# Patient Record
Sex: Male | Born: 1955 | Race: White | Hispanic: No | Marital: Married | Smoking: Never smoker
Health system: Southern US, Community
[De-identification: ages and names within clinical notes are randomized; demographics above are authoritative.]

## PROBLEM LIST (undated history)

## (undated) DIAGNOSIS — Z9889 Other specified postprocedural states: Secondary | ICD-10-CM

## (undated) DIAGNOSIS — M1990 Unspecified osteoarthritis, unspecified site: Secondary | ICD-10-CM

## (undated) DIAGNOSIS — I10 Essential (primary) hypertension: Secondary | ICD-10-CM

## (undated) DIAGNOSIS — R112 Nausea with vomiting, unspecified: Secondary | ICD-10-CM

## (undated) HISTORY — PX: APPENDECTOMY: SHX54

## (undated) HISTORY — PX: JOINT REPLACEMENT: SHX530

---

## 2005-03-02 ENCOUNTER — Inpatient Hospital Stay (HOSPITAL_COMMUNITY): Admission: RE | Admit: 2005-03-02 | Discharge: 2005-03-05 | Payer: Self-pay | Admitting: Orthopedic Surgery

## 2005-04-11 ENCOUNTER — Observation Stay (HOSPITAL_COMMUNITY): Admission: RE | Admit: 2005-04-11 | Discharge: 2005-04-12 | Payer: Self-pay | Admitting: Orthopedic Surgery

## 2006-07-08 IMAGING — CR DG CHEST 2V
2 series · 2 of 2 positions shown · non-contrast
Comparison: none

CLINICAL DATA: Preoperative respiratory exam for osteoarthritis of the knee.  Hypertension.  
 CHEST - 2 VIEWS:   
 The heart is at the upper limits of normal in size.  The mediastinum is unremarkable.  The lungs are clear.   No effusions.  Ordinary degenerative changes affect the spine.

[view not recorded (1 of 2)]
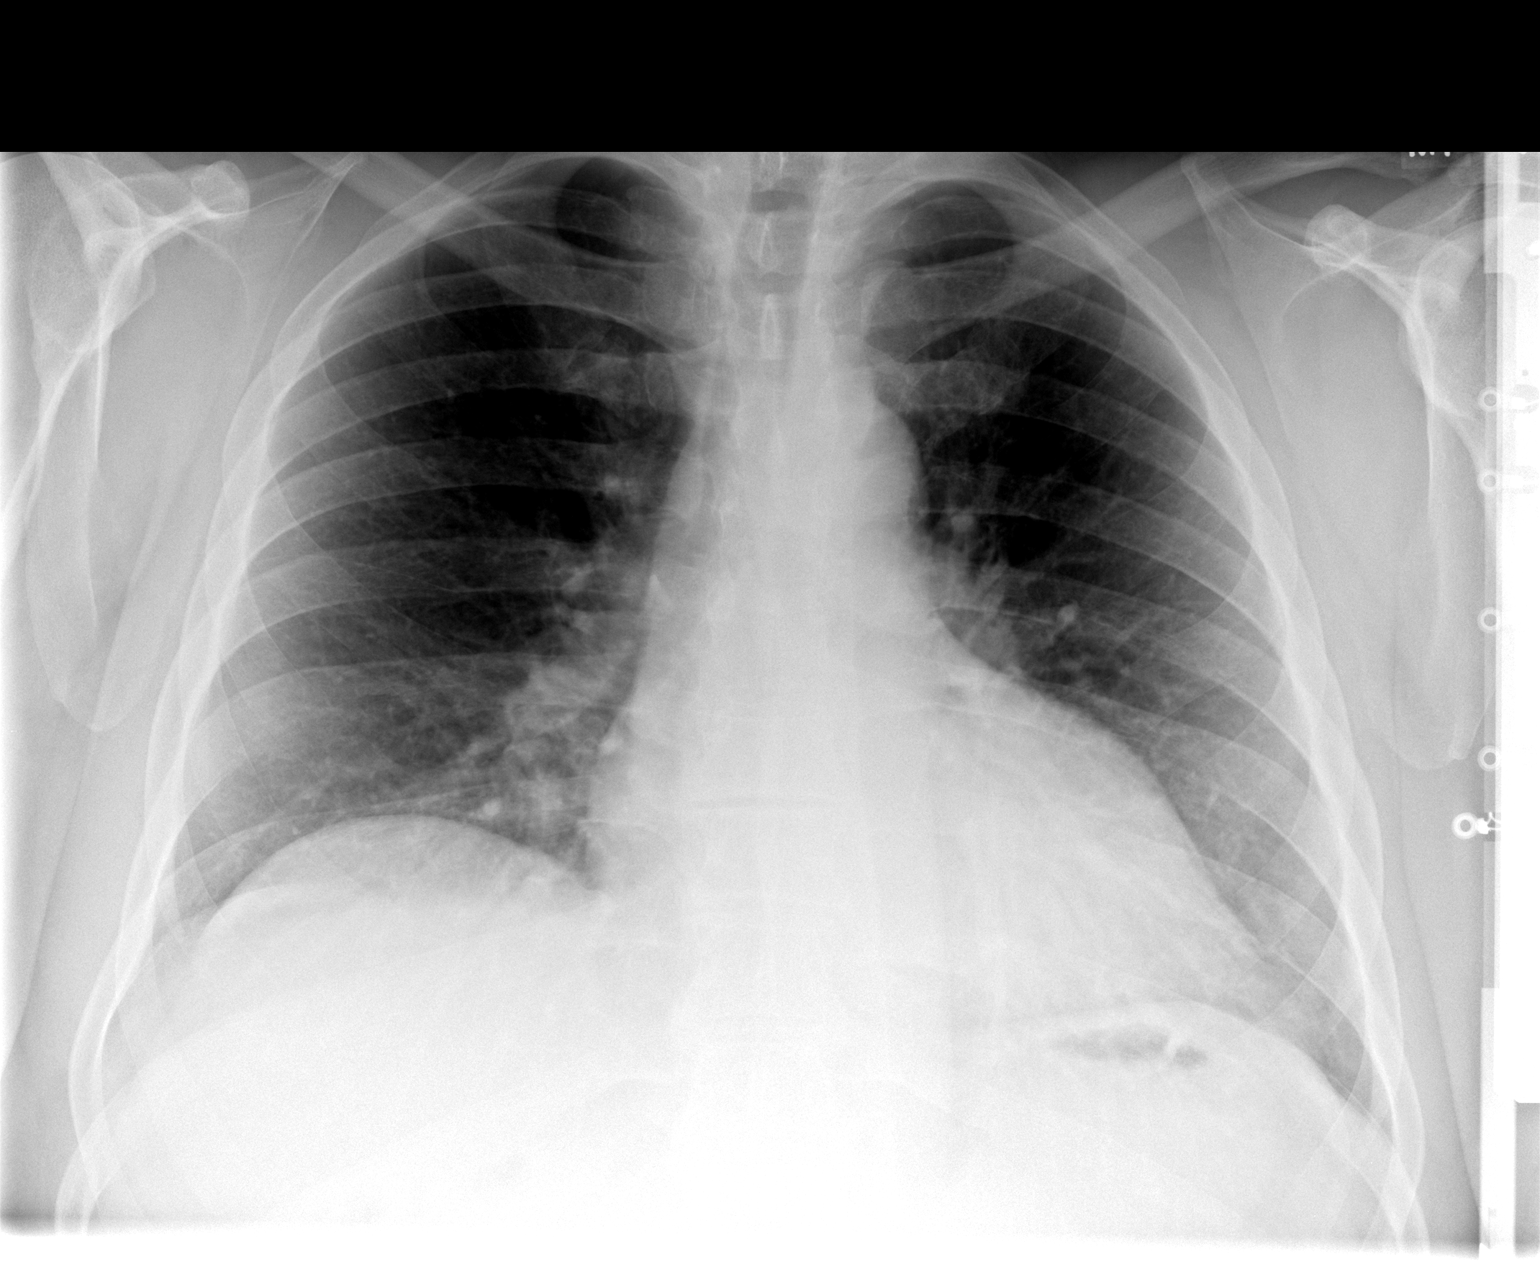

[view not recorded (2 of 2)]
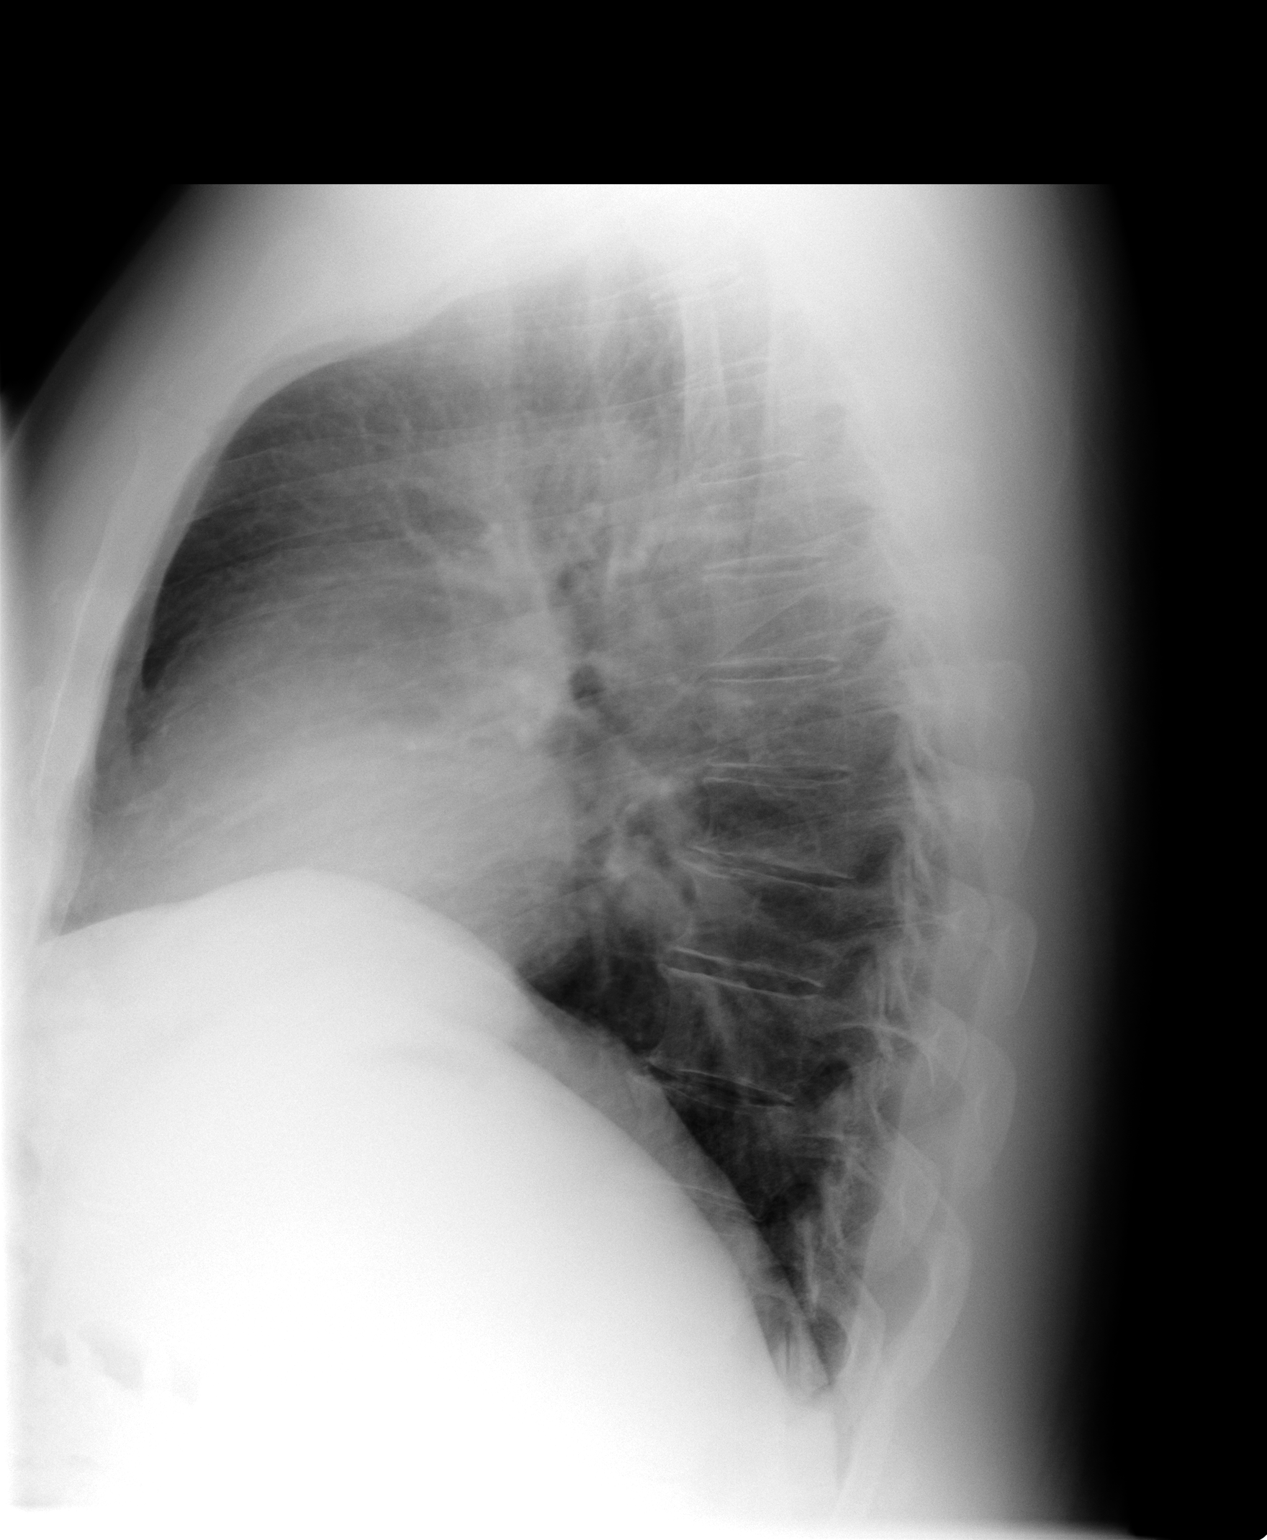

[2 of 2 positions shown; findings below may reference images not displayed]

IMPRESSION: No active disease.

## 2008-12-29 ENCOUNTER — Ambulatory Visit (HOSPITAL_COMMUNITY): Admission: RE | Admit: 2008-12-29 | Discharge: 2008-12-29 | Payer: Self-pay | Admitting: Orthopedic Surgery

## 2011-02-11 NOTE — Discharge Summary (Signed)
NAMEJAKOTA, MANTHEI                ACCOUNT NO.:  [id number]   MEDICAL RECORD NO.:  [id number]          PATIENT TYPE:  INP   LOCATION:  1603                         FACILITY:  [facility]   PHYSICIAN:  Ollen Gross, M.D.    DATE OF BIRTH:  [birth year]   DATE OF ADMISSION:  03/02/2005  DATE OF DISCHARGE:  03/05/2005                                 DISCHARGE SUMMARY   ADMISSION DIAGNOSES:  1.  Osteoarthritis of the right knee.  2.  Hypertension.   DISCHARGE DIAGNOSES:  1.  Osteoarthritis of the right knee status post right total knee      arthroplasty.  Computer navigation.  2.  Hypertension.   PROCEDURE:  On March 02, 2005 a right total knee arthroplasty.   SURGEON:  Dr. Lequita Halt.   ASSISTANT:  Alexzandrew L. Perkins, P.A.-C.   ANESTHESIA:  The surgery was assisted with computer navigation with spinal  anesthesia.   BLOOD LOSS:  Minimal.   DRAINS:  Hemovac drain x1.   TOURNIQUET TIME:  67 minutes at 300 mmHg.   CONSULTS:  None.   BRIEF HISTORY:  Cage is a 55 year old male with severe end-stage arthritis  of the right knee and intractable pain.  He failed non-operative management.  He now presents for a total knee arthroplasty and laboratory data.  A  preoperative CBC with a hemoglobin of 15.8, hematocrit of 44.9, white cell  count 6.3, differential within normal limits.  Postoperative hemoglobin of  14.6.  The last H&H was 12.0 and 33.7.  PT and PTT preoperatively were 12.5  and 28 respectively.  INR is 0.9.  Serum protimes are as follows:  PT-INR of  16.2 and 1.5.  Chem-7 on admission with a low ALP of 33.  Elevated total  bilirubin of 1.3.  The remaining chemistry panel within normal limits.  Serial B-METS were followed.  B-MET and electrolytes remained within normal  limits.  Glucose of 130 and 144, back down to 109.  Urinalysis negative.  Blood group type is B positive.  A chest x-ray on February 24, 2005 no active  disease.  EKG on August 05, 2004 with sinus rhythm.  Normal  EKG.   HOSPITAL COURSE:  He was admitted to the hospital and taken to the OR.  He  underwent the above procedure and tolerated it well.  Later was transferred  to the orthopedic floor.  P.o. and PCA analgesics were started.  The patient  had a fairly decent night.  He did have one episode of vomiting by that next  morning.  Otherwise a decent postoperative night.  He started getting up out  of bed on day one.  The Hemovac drain was pulled and therapy was initiated.  Hemoglobin was followed closely.  By day two the patient was already doing  great and weaned over to p.o. medications.  PCA was discontinued.  By day  two the patient was already up and ambulating in the hallway.  He was  feeling good.  It was felt that they were doing so well, he may be able to  go home the following  day.  Arrangements were made.  The patient was seen in  rounds on day three.  He was tolerating his medications and was discharged  home.   DISCHARGE PLANS:  1.  Discharge home on March 05, 2005.  2.  Discharge diagnoses please see above.  3.  Discharge medications:      1.  Percocet.      2.  Robaxin.      3.  Coumadin.  4.  Diet:  Resume previous home diet.  5.  Activity:  Weightbearing as tolerated.  Right lower extremity gait      training ambulation and ADLs as per home health nursing with protimes.      DVT prophylaxis.  6.  Followup:  Two weeks from surgery.  7.  Disposition: Home.  8.  Condition on discharge:  Improved.       ALP/MEDQ  D:  04/06/2005  T:  04/06/2005  Job:  [id number]   cc:   Shelda Jakes, M.D.  [employer]

## 2011-02-11 NOTE — Op Note (Signed)
[name], [name]                ACCOUNT NO.:  [id number]   MEDICAL RECORD NO.:  [id number]          PATIENT TYPE:  OBV   LOCATION:  1509                         FACILITY:  [facility]   PHYSICIAN:  Ollen Gross, M.D.    DATE OF BIRTH:  Jun 29, 1956   DATE OF PROCEDURE:  04/11/2005  DATE OF DISCHARGE:                                 OPERATIVE REPORT   PREOPERATIVE DIAGNOSIS:  Arthrofibrosis right total knee.   POSTOPERATIVE DIAGNOSIS:  Arthrofibrosis right total knee.   PROCEDURE:  Right knee closed manipulation.   SURGEON:  Ollen Gross, M.D.   ANESTHESIA:  General by a mask.   Premanipulation range of motion 10-80, post manipulation range of motion 0-  115.   COMPLICATIONS:  None.   CONDITION.:  Stable to recovery.   BRIEF CLINICAL NOTE:  Cody Scott is a 55 year old male who had a right total knee  arthroplasty done approximately six weeks ago. He has worked extremely hard  with physical therapy but has developed arthrofibrosis. He presents now for  closed manipulation.   PROCEDURE IN DETAIL:  After successful administration of general anesthetic,  the exam under anesthesia reveals range of approximately 10-80 degrees. By  placing my chest up against the proximal tibia and applying flexion force,  the adhesions are audibly lysed and I was able to get her flexed to between  115 and 120 degrees. We were able to also achieve full extension and improve  patellar mobility. His flexion against gravity after that was complete was  about 115. He is then subsequently awakened and transported to recovery in  stable condition.       FA/MEDQ  D:  04/11/2005  T:  04/12/2005  Job:  [id number]

## 2011-02-11 NOTE — H&P (Signed)
Cody Scott, Cody Scott                ACCOUNT NO.:  [id number]   MEDICAL RECORD NO.:  [id number]          PATIENT TYPE:  INP   LOCATION:  NA                           [facility]ACILITY[facility]  [facility]   PHYSICIAN:  Ollen Gross, M.D.    DATE OF BIRTH:  22-May-1956   DATE OF ADMISSION:  03/02/2005  DATE OF DISCHARGE:                                HISTORY & PHYSICAL   CHIEF COMPLAINT:  Right knee pain.   HISTORY OF PRESENT ILLNESS:  Patient is a 55 year old male seen by Dr.  Despina Hick for a longstanding history of pain in the right knee.  It has gotten  worse over the past several years.  Injured it about 25 years ago playing  ball.  He tries to remain active, but the knee is limiting what he can and  cannot do.  He is seen by Dr. Despina Hick in the office, where x-rays show that  he has severe end-stage arthritic changes in the right knee with bone-on-  bone medial and patellofemoral loss and lateral narrowing.  It is to the  point where it now hurts throughout the day and also getting pain at night.  He is to the point where he would like to have something done about it.  Risks and benefits have been discussed about knee replacement.  It is felt  he would benefit from the surgery, and he is subsequently admitted to the  hospital for procedure.   ALLERGIES:  No known drug allergies.   CURRENT MEDICATIONS:  1.  Diovan/hydrochlorothiazide 80/12.5 daily.  2.  Diclofenac 75 mg.   PAST MEDICAL HISTORY:  1.  Hypertension.  2.  Osteoarthritis.   PAST SURGICAL HISTORY:  1.  Left total knee replacement in 1987.  2.  Revision, acetabular component, left total hip in 1994.  3.  Appendectomy.  4.  Closed reduction of the left total hip for wound dislocation.   SOCIAL HISTORY:  Married.  Works in Control and instrumentation engineer.  Nonsmoker.  No  alcohol.  Two children.  Wife will be assisting with care after surgery.  Two-story home with 15 steps.   FAMILY HISTORY:  Mother, age 5, living.  Father, age 51, living.  Mother is  in good health.  Father with a history of pulmonary fibrosis and a smoker.  He has a brother who is also in good health.   REVIEW OF SYSTEMS:  GENERAL:  No fevers, chills, night sweats.  NEURO:  No  seizures, syncope, paralysis.  RESPIRATORY:  No shortness of breath,  productive cough, or hemoptysis.  CARDIOVASCULAR:  No chest pain, angina,  orthopnea.  GI:  No nausea, vomiting, diarrhea, constipation.  GU:  No  dysuria, hematuria, or discharge.  MUSCULOSKELETAL:  Right knee, from the  history of present illness.   PHYSICAL EXAMINATION:  VITAL SIGNS:  Pulse 60, respirations 12, blood  pressure 130/82.  GENERAL:  A 55 year old white male who is well-developed and well-nourished,  muscular build in no acute distress.  He is accompanied by his wife.  He is  alert, oriented and cooperative.  Very pleasant at the time of  the exam.  HEENT:  Normocephalic and atraumatic.  Pupils are round and reactive.  Oropharynx is clear.  EOMs are intact.  NECK:  Supple.  No carotid bruits.  CHEST:  Clear anterior and posterior chest wall.  No rales, rhonchi or  wheezes.  HEART:  Regular rate and rhythm without murmur.  S1 and S2 noted.  ABDOMEN:  Soft, nontender.  Bowel sounds present.  RECTAL/BREASTS/GENITALIA:  Not done.  Not pertinent to the present illness.  EXTREMITIES:  Right knee:  The right knee shows a varus deformity,  malalignment with a range of motion of 10-110.  Marked crepitus noted.  More  tender medially than laterally.  No instability.   IMPRESSION:  1.  Osteoarthritis, right knee.  2.  Hypertension.   PLAN:  Patient will be admitted to [facility] to undergo a right  total knee replacement/arthroplasty.  Surgery will be performed by Dr. Trudee Grip.       ALP/MEDQ  D:  03/01/2005  T:  03/01/2005  Job:  [id number]   cc:   Dr. Phoebe Sharps [facility]

## 2011-02-11 NOTE — Op Note (Signed)
Cody Scott, Cody Scott                ACCOUNT NO.:  [id number]   MEDICAL RECORD NO.:  [id number]          PATIENT TYPE:  INP   LOCATION:  0001                         FACILITY:  [facility]   PHYSICIAN:  Ollen Gross, M.D.    DATE OF BIRTH:  [birth year]   DATE OF PROCEDURE:  03/02/2005  DATE OF DISCHARGE:                                 OPERATIVE REPORT   PREOPERATIVE DIAGNOSES:  Osteoarthritis right knee.   POSTOPERATIVE DIAGNOSES:  Osteoarthritis right knee.   PROCEDURE:  Right total knee arthroplasty computer navigated.   SURGEON:  Ollen Gross, M.D.   ASSISTANT:  Avel Peace, P.A.-C.   ANESTHESIA:  Spinal.   ESTIMATED BLOOD LOSS:  Minimal.   DRAINS:  Hemovac x1.   TOURNIQUET TIME:  67 minutes at 300 mmHg.   COMPLICATIONS:  None.   CONDITION:  Stable to recovery. Brief   BRIEF CLINICAL NOTE:  Cody Scott is a 55 year old male with severe end-stage  osteoarthritis of the right knee with intractable pain. He has failed  nonoperative management and presents now for right total knee arthroplasty.   PROCEDURE IN DETAIL:  After successful administration of spinal anesthetic,  a tourniquet is placed high on his right thigh and right lower extremity  prepped and draped in the usual sterile fashion. The extremities wrapped in  Esmarch, knee flexed and tourniquet inflated to 300 mmHg. A standard midline  incision is made with a 10 blade through the subcutaneous tissue to the  level of the extensor mechanism where a fresh blade is used to make a medial  parapatellar arthrotomy. The soft tissue over the proximal and medial tibia  is subperiosteally elevated to the joint line with a knife and into the  semimembranosus bursa with a Cobb elevator. The soft tissue over the  proximal lateral tibia is also elevated with attention being paid to  avoiding the patellar tendon on the tibial tubercle. The patella is everted,  knee flexed 90 degrees and ACL and PCL removed. The 4 mm Shantz screws  were  then placed two into the tibia and two into the femur for placement of the  computerized arrays.  Anatomic data is then input into the computer to  generate the femoral and tibial models. Overall alignment is approximately 7  degrees of varus with a 4 degree flexion contracture.   We subsequently did the distal femoral cut. The femoral cutting block is  placed to remove approximately 11 mm off the distal femur given the flexion  contracture and given the size six knee. The cut is made in neutral  flexion/extension, neutral varus-valgus. The block is pinned and resection  made with an oscillating saw. The verification device is placed and  demonstrates that the cut was made as planned. Per the computerized sizing,  a size six is the most appropriate and we checked intraop which confirmed  this. The rotational block is placed to reference off the epicondylar axis.  This is checked with the navigation device and rotation is marked and the  size six block pinned. The anterior, posterior, and chamfer cuts are  subsequently made.  The tibia is subluxed forward and the menisci removed. The proximal tibial  cutting guide is placed so as to remove approximately 10 mm of the  nondeficient lateral side and make the cut neutral varus-valgus with  approximately 2-3 degrees of posterior slope. The resection guide is placed  under guidance of the computer and the block pinned in the appropriate  position. The resection is made with an oscillating saw. It is checked with  the verification device and demonstrates that the cut was made as planned.  The size five is the most appropriate tibial size and the proximal tibia is  prepared with the modular drill and keel punch for a size five. Femoral  preparation is completed with the intercondylar guide for a size six.   The size five mobile bearing tibial trial with a size six posterior  stabilized femoral trial and a 10 mm posterior stabilized  rotating platform  insert trial are placed. With the 10, full extension is achieved with  excellent varus valgus balance throughout full range of motion. The  alignment as demonstrated on the computer is less than 2 degrees varus  within 2 degrees of full extension. The pins were then removed from the  tibia and femur. The patella was everted and thickness measured to be 27 mm.  Freehand resection was taken to 15 mm. The 41 template was placed, lug holes  were drilled, trial patella was placed and it tracks normally. With the 10  mL insert, he had excellent varus and valgus stability throughout full range  of motion. The osteophytes and then removed off the posterior femur with a  trial in place. All trials are removed and the cut bone surfaces were  prepared with pulsatile lavage. Cement is mixed and once ready for  implantation, the size five mobile bearing tibial tray, size six posterior  stabilized femur and 41 patella are cemented into place and the patella was  held with a clamp. The trial 10 mm insert is placed, knee held in full  extension and all extruded cement removed. Once the cement was fully  hardened then the permanent 10 mm posterior stabilized rotating platform  insert is placed into the tibial tray. The wound was copiously irrigated  with saline solution and extensor mechanism closed over a Hemovac drain with  interrupted #1 PDS. Flexion against gravity to 135 degrees. Tourniquet was  released with a total time of 67 minutes. Subcu was closed with interrupted  2-0 Vicryl, subcuticular running 4-0 Monocryl. The Hemovac drain is hooked  to suction and the incision cleaned and dried and a bulky sterile dressing  applied. He was then awakened, placed into a knee immobilizer and  transported to recovery in stable condition.       FA/MEDQ  D:  03/02/2005  T:  03/03/2005  Job:  [id number]

## 2014-11-04 ENCOUNTER — Ambulatory Visit: Payer: Self-pay | Admitting: Orthopedic Surgery

## 2014-11-04 NOTE — Progress Notes (Signed)
Preoperative surgical orders have been place into the Epic hospital system for Myrtis HoppingLarry S Blinn on 11/04/2014, 11:33 AM  by Patrica DuelPERKINS, Traquan Duarte for surgery on 12-03-2014.  Preop Total Hip - Anterior Approach orders including Experel Injecion, IV Tylenol, and IV Decadron as long as there are no contraindications to the above medications. Avel Peacerew Lamarkus Nebel, PA-C

## 2014-11-20 ENCOUNTER — Ambulatory Visit: Payer: Self-pay | Admitting: Orthopedic Surgery

## 2014-11-20 NOTE — H&P (Signed)
Cody HoppingLarry S. Ladouceur DOB: 1956/01/05 Married / Language: English / Race: White Male Date of Admission:  12-03-2014 CC:  Right Hip Pain History of Present Illness The patient is a 59 year old male who comes in for a preoperative History and Physical. The patient is scheduled for a right total hip arthroplasty (anterior approach) to be performed by Dr. Gus RankinFrank V. Aluisio, MD [facility] on 12-03-2014. The patient is a 59 year old male who presents for follow up of their hip. They are now 6 month(s) out from last evaluation in the office. Symptoms reported today include: pain (along lateral aspect, as well as radiating in to his thigh. The pain sometimes keeps him awake at night. He reports that at his last visit he discussed some loss of range of motion in his hip. He is wondering how much of this might be from his hip, and decided to come in to have the hip evaluated). and report their pain level to be moderate to severe. Current treatment includes: activity modification and NSAIDs (meloxicam 7.5).  Unfortunately, the right hip is getting progressively worse. Pain is in the groin radiating to his knee, and it is limiting what he can and cannot do. He still has a lot of movement in the hip. The pain is mostly in his knee, but the hip itself hurts also. He initially was going to have this evaluated in [place], but decided to get it evaluated here too, as it is more convenient for him.  He is ready to proceed with surgery for the right hip at this time. They have been treated conservatively in the past for the above stated problem and despite conservative measures, they continue to have progressive pain and severe functional limitations and dysfunction. They have failed non-operative management including home exercise, medications, and injections. It is felt that they would benefit from undergoing total joint replacement. Risks and benefits of the procedure have been discussed with the patient and they  elect to proceed with surgery. There are no active contraindications to surgery such as ongoing infection or rapidly progressive neurological disease.   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) High blood pressure Gout  Allergies No Known Drug Allergies  Family History Cancer brother Hypertension mother Depression mother  Social History  Drug/Alcohol Rehab (Currently) no Alcohol use never consumed alcohol Exercise Exercises weekly; does running / walking Drug/Alcohol Rehab (Previously) no Children 2 Number of flights of stairs before winded greater than 5 Marital status married Living situation live with spouse Illicit drug use no Current work status working full time Pain Contract no Tobacco use never smoker Tobacco / smoke exposure no Post-Surgical Plans HOME  Medication History Meloxicam (7.5MG  Tablet, Oral) Active. Losartan Potassium-HCTZ (100-12.5MG  Tablet, Oral) Active.  Past Surgical History  [facility] Tonsillectomy Total Knee Replacement right Appendectomy   Review of Systems General Not Present- Chills, Fatigue, Fever, Memory Loss, Night Sweats, Weight Gain and Weight Loss. Skin Not Present- Eczema, Hives, Itching, Lesions and Rash. HEENT Not Present- Dentures, Double Vision, Headache, Hearing Loss, Tinnitus and Visual Loss. Respiratory Not Present- Allergies, Chronic Cough, Coughing up blood, Shortness of breath at rest and Shortness of breath with exertion. Cardiovascular Not Present- Chest Pain, Difficulty Breathing Lying Down, Murmur, Palpitations, Racing/skipping heartbeats and Swelling. Gastrointestinal Not Present- Abdominal Pain, Bloody Stool, Constipation, Diarrhea, Difficulty Swallowing, Heartburn, Jaundice, Loss of appetitie, Nausea and Vomiting. Male Genitourinary Not Present- Blood  in Urine, Discharge,  Flank Pain, Incontinence, Painful Urination, Urgency, Urinary frequency, Urinary Retention, Urinating at Night and Weak urinary stream. Musculoskeletal Present- Joint Pain. Not Present- Back Pain, Joint Swelling, Morning Stiffness, Muscle Pain, Muscle Weakness and Spasms. Neurological Not Present- Blackout spells, Difficulty with balance, Dizziness, Paralysis, Tremor and Weakness. Psychiatric Not Present- Insomnia.   Vitals Weight: 255 lb Height: 73in Weight was reported by patient. Height was reported by patient. Body Surface Area: 2.39 m Body Mass Index: 33.64 kg/m  BP: 138/78 (Sitting, Right Arm, Standard)   Physical Exam General Mental Status -Alert, cooperative and good historian. General Appearance-pleasant, Not in acute distress. Orientation-Oriented X3. Build & Nutrition-Well nourished and Well developed.  Head and Neck Head-normocephalic, atraumatic . Neck Global Assessment - supple, no bruit auscultated on the right, no bruit auscultated on the left.  Eye Vision-Wears contact lenses. Pupil - Bilateral-Regular and Round. Motion - Bilateral-EOMI.  Chest and Lung Exam Auscultation Breath sounds - clear at anterior chest wall and clear at posterior chest wall. Adventitious sounds - No Adventitious sounds.  Cardiovascular Auscultation Rhythm - Regular rate and rhythm. Heart Sounds - S1 WNL and S2 WNL. Murmurs & Other Heart Sounds - Auscultation of the heart reveals - No Murmurs.  Abdomen Palpation/Percussion Tenderness - Abdomen is non-tender to palpation. Rigidity (guarding) - Abdomen is soft. Auscultation Auscultation of the abdomen reveals - Bowel sounds normal.  Male Genitourinary Note: Not done, not pertinent to present illness   Musculoskeletal Note: On examination, he is alert and oriented in no apparent distress. Evaluation of his right hip has flexion to 90, minimal internal rotation to about 20, external rotation 20,  abduction 20. Right knee has no swelling, range is zero to 125. No tenderness or instability. Any knee pain that he is experiencing is reproduced by rotation of his hip.  RADIOGRAPHS: AP pelvis and lateral of the right hip show bone-on-bone arthritis with subchondral cystic formation and osteophyte formation.   Assessment & Plan  Primary osteoarthritis of right hip (M16.11) Note:Surgical Plans: Right Total Hip Repalcement - Anterior Approach  Disposition: Home  PCP: Dr. Leonette MostKalish  IV TXA  Anesthesia Issues: Postop Nausea  Signed electronically by Lauraine RinneAlexzandrew L Lashun Ramseyer, III PA-C

## 2014-11-24 NOTE — Patient Instructions (Addendum)
Cody HoppingLarry S Gills  11/24/2014   Your procedure is scheduled on:12/03/2014     [facility]  Entrance and follow signs to               Short Stay Center at     1230pm  Call this number if you have problems the morning of surgery [phone]   Remember:  Do not eat food after midnite. May have clear liquids until 0730am then nothing by mouth.      Take these medicines the morning of surgery with A SIP OF WATER: none                                You may not have any metal on your body including hair pins and              piercings  Do not wear jewelry,  lotions, powders or perfumes., deodorant.                            Men may shave face and neck.   Do not bring valuables to the hospital. [place] IS NOT             RESPONSIBLE   FOR VALUABLES.  Contacts, dentures or bridgework may not be worn into surgery.  Leave suitcase in the car. After surgery it may be brought to your room.      Special Instructions: coughing and deep breathing exericses, leg exercises               Please read over the following fact sheets you were given: _____________________________________________________________________                CLEAR LIQUID DIET   Foods Allowed                                                                     Foods Excluded  Coffee and tea, regular and decaf                             liquids that you cannot  Plain Jell-O in any flavor                                             see through such as: Fruit ices (not with fruit pulp)                                     milk, soups, orange juice  Iced Popsicles                                    All solid food Carbonated beverages, regular and diet  Cranberry, grape and apple juices Sports drinks like Gatorade Lightly seasoned clear broth or consume(fat free) Sugar, honey syrup  Sample Menu Breakfast                                Lunch                                      Supper Cranberry juice                    Beef broth                            Chicken broth Jell-O                                     Grape juice                           Apple juice Coffee or tea                        Jell-O                                      Popsicle                                                Coffee or tea                        Coffee or tea  _____________________________________________________________________  [facility] - Preparing for Surgery Before surgery, you can play an important role.  Because skin is not sterile, your skin needs to be as free of germs as possible.  You can reduce the number of germs on your skin by washing with CHG (chlorahexidine gluconate) soap before surgery.  CHG is an antiseptic cleaner which kills germs and bonds with the skin to continue killing germs even after washing. Please DO NOT use if you have an allergy to CHG or antibacterial soaps.  If your skin becomes reddened/irritated stop using the CHG and inform your nurse when you arrive at Short Stay. Do not shave (including legs and underarms) for at least 48 hours prior to the first CHG shower.  You may shave your face/neck. Please follow these instructions carefully:  1.  Shower with CHG Soap the night before surgery and the  morning of Surgery.  2.  If you choose to wash your hair, wash your hair first as usual with your  normal  shampoo.  3.  After you shampoo, rinse your hair and body thoroughly to remove the  shampoo.                           4.  Use CHG as you would any other liquid soap.  You can apply chg directly  to the skin and wash  Gently with a scrungie or clean washcloth.  5.  Apply the CHG Soap to your body ONLY FROM THE NECK DOWN.   Do not use on face/ open                           Wound or open sores. Avoid contact with eyes, ears mouth and genitals (private parts).                       Wash face,  Genitals  (private parts) with your normal soap.             6.  Wash thoroughly, paying special attention to the area where your surgery  will be performed.  7.  Thoroughly rinse your body with warm water from the neck down.  8.  DO NOT shower/wash with your normal soap after using and rinsing off  the CHG Soap.                9.  Pat yourself dry with a clean towel.            10.  Wear clean pajamas.            11.  Place clean sheets on your bed the night of your first shower and do not  sleep with pets. Day of Surgery : Do not apply any lotions/deodorants the morning of surgery.  Please wear clean clothes to the hospital/surgery center.  FAILURE TO FOLLOW THESE INSTRUCTIONS MAY RESULT IN THE CANCELLATION OF YOUR SURGERY PATIENT SIGNATURE_________________________________  NURSE SIGNATURE__________________________________  ________________________________________________________________________  WHAT IS A BLOOD TRANSFUSION? Blood Transfusion Information  A transfusion is the replacement of blood or some of its parts. Blood is made up of multiple cells which provide different functions.  Red blood cells carry oxygen and are used for blood loss replacement.  White blood cells fight against infection.  Platelets control bleeding.  Plasma helps clot blood.  Other blood products are available for specialized needs, such as hemophilia or other clotting disorders. BEFORE THE TRANSFUSION  Who gives blood for transfusions?   Healthy volunteers who are fully evaluated to make sure their blood is safe. This is blood bank blood. Transfusion therapy is the safest it has ever been in the practice of medicine. Before blood is taken from a donor, a complete history is taken to make sure that person has no history of diseases nor engages in risky social behavior (examples are intravenous drug use or sexual activity with multiple partners). The donor's travel history is screened to minimize risk of  transmitting infections, such as malaria. The donated blood is tested for signs of infectious diseases, such as HIV and hepatitis. The blood is then tested to be sure it is compatible with you in order to minimize the chance of a transfusion reaction. If you or a relative donates blood, this is often done in anticipation of surgery and is not appropriate for emergency situations. It takes many days to process the donated blood. RISKS AND COMPLICATIONS Although transfusion therapy is very safe and saves many lives, the main dangers of transfusion include:  1. Getting an infectious disease. 2. Developing a transfusion reaction. This is an allergic reaction to something in the blood you were given. Every precaution is taken to prevent this. The decision to have a blood transfusion has been considered carefully by your caregiver before blood is given. Blood is not given unless the benefits  outweigh the risks. AFTER THE TRANSFUSION  Right after receiving a blood transfusion, you will usually feel much better and more energetic. This is especially true if your red blood cells have gotten low (anemic). The transfusion raises the level of the red blood cells which carry oxygen, and this usually causes an energy increase.  The nurse administering the transfusion will monitor you carefully for complications. HOME CARE INSTRUCTIONS  No special instructions are needed after a transfusion. You may find your energy is better. Speak with your caregiver about any limitations on activity for underlying diseases you may have. SEEK MEDICAL CARE IF:   Your condition is not improving after your transfusion.  You develop redness or irritation at the intravenous (IV) site. SEEK IMMEDIATE MEDICAL CARE IF:  Any of the following symptoms occur over the next 12 hours:  Shaking chills.  You have a temperature by mouth above 102 F (38.9 C), not controlled by medicine.  Chest, back, or muscle pain.  People around you  feel you are not acting correctly or are confused.  Shortness of breath or difficulty breathing.  Dizziness and fainting.  You get a rash or develop hives.  You have a decrease in urine output.  Your urine turns a dark color or changes to pink, red, or brown. Any of the following symptoms occur over the next 10 days:  You have a temperature by mouth above 102 F (38.9 C), not controlled by medicine.  Shortness of breath.  Weakness after normal activity.  The white part of the eye turns yellow (jaundice).  You have a decrease in the amount of urine or are urinating less often.  Your urine turns a dark color or changes to pink, red, or brown. Document Released: 09/09/2000 Document Revised: 12/05/2011 Document Reviewed: 04/28/2008 ExitCare Patient Information 2014 [place].  _______________________________________________________________________  Incentive Spirometer  An incentive spirometer is a tool that can help keep your lungs clear and active. This tool measures how well you are filling your lungs with each breath. Taking long deep breaths may help reverse or decrease the chance of developing breathing (pulmonary) problems (especially infection) following:  A long period of time when you are unable to move or be active. BEFORE THE PROCEDURE   If the spirometer includes an indicator to show your best effort, your nurse or respiratory therapist will set it to a desired goal.  If possible, sit up straight or lean slightly forward. Try not to slouch.  Hold the incentive spirometer in an upright position. INSTRUCTIONS FOR USE  3. Sit on the edge of your bed if possible, or sit up as far as you can in bed or on a chair. 4. Hold the incentive spirometer in an upright position. 5. Breathe out normally. 6. Place the mouthpiece in your mouth and seal your lips tightly around it. 7. Breathe in slowly and as deeply as possible, raising the piston or the ball toward the top  of the column. 8. Hold your breath for 3-5 seconds or for as long as possible. Allow the piston or ball to fall to the bottom of the column. 9. Remove the mouthpiece from your mouth and breathe out normally. 10. Rest for a few seconds and repeat Steps 1 through 7 at least 10 times every 1-2 hours when you are awake. Take your time and take a few normal breaths between deep breaths. 11. The spirometer may include an indicator to show your best effort. Use the indicator as a goal to  work toward during each repetition. 12. After each set of 10 deep breaths, practice coughing to be sure your lungs are clear. If you have an incision (the cut made at the time of surgery), support your incision when coughing by placing a pillow or rolled up towels firmly against it. Once you are able to get out of bed, walk around indoors and cough well. You may stop using the incentive spirometer when instructed by your caregiver.  RISKS AND COMPLICATIONS  Take your time so you do not get dizzy or light-headed.  If you are in pain, you may need to take or ask for pain medication before doing incentive spirometry. It is harder to take a deep breath if you are having pain. AFTER USE  Rest and breathe slowly and easily.  It can be helpful to keep track of a log of your progress. Your caregiver can provide you with a simple table to help with this. If you are using the spirometer at home, follow these instructions: [place] IF:   You are having difficultly using the spirometer.  You have trouble using the spirometer as often as instructed.  Your pain medication is not giving enough relief while using the spirometer.  You develop fever of 100.5 F (38.1 C) or higher. SEEK IMMEDIATE MEDICAL CARE IF:   You cough up bloody sputum that had not been present before.  You develop fever of 102 F (38.9 C) or greater.  You develop worsening pain at or near the incision site. MAKE SURE YOU:   Understand  these instructions.  Will watch your condition.  Will get help right away if you are not doing well or get worse. Document Released: 01/23/2007 Document Revised: 12/05/2011 Document Reviewed: 03/26/2007 [facility] Patient Information 2014 [place], [place].   ________________________________________________________________________

## 2014-11-26 ENCOUNTER — Encounter (HOSPITAL_COMMUNITY)
Admission: RE | Admit: 2014-11-26 | Discharge: 2014-11-26 | Disposition: A | Discharge status: Home / Self Care | Payer: BLUE CROSS/BLUE SHIELD | Source: Ambulatory Visit | Attending: Orthopedic Surgery | Admitting: Orthopedic Surgery

## 2014-11-26 ENCOUNTER — Encounter (HOSPITAL_COMMUNITY): Payer: Self-pay

## 2014-11-26 DIAGNOSIS — M1611 Unilateral primary osteoarthritis, right hip: Secondary | ICD-10-CM | POA: Insufficient documentation

## 2014-11-26 DIAGNOSIS — Z01812 Encounter for preprocedural laboratory examination: Secondary | ICD-10-CM | POA: Insufficient documentation

## 2014-11-26 HISTORY — DX: Nausea with vomiting, unspecified: R11.2

## 2014-11-26 HISTORY — DX: Other specified postprocedural states: Z98.890

## 2014-11-26 HISTORY — DX: Essential (primary) hypertension: I10

## 2014-11-26 HISTORY — DX: Unspecified osteoarthritis, unspecified site: M19.90

## 2014-11-26 LAB — CBC
HCT: 47.3 % (ref 39.0–52.0)
Hemoglobin: 16.7 g/dL (ref 13.0–17.0)
MCH: 31.7 pg (ref 26.0–34.0)
MCHC: 35.3 g/dL (ref 30.0–36.0)
MCV: 89.9 fL (ref 78.0–100.0)
Platelets: 202 10*3/uL (ref 150–400)
RBC: 5.26 MIL/uL (ref 4.22–5.81)
RDW: 13.1 % (ref 11.5–15.5)
WBC: 5.8 10*3/uL (ref 4.0–10.5)

## 2014-11-26 LAB — URINALYSIS, ROUTINE W REFLEX MICROSCOPIC
Bilirubin Urine: NEGATIVE
Glucose, UA: NEGATIVE mg/dL
Hgb urine dipstick: NEGATIVE
Ketones, ur: NEGATIVE mg/dL
Leukocytes, UA: NEGATIVE
NITRITE: NEGATIVE
Protein, ur: NEGATIVE mg/dL
Specific Gravity, Urine: 1.007 (ref 1.005–1.030)
UROBILINOGEN UA: 0.2 mg/dL (ref 0.0–1.0)
pH: 6 (ref 5.0–8.0)

## 2014-11-26 LAB — APTT: APTT: 36 s (ref 24–37)

## 2014-11-26 LAB — COMPREHENSIVE METABOLIC PANEL
ALK PHOS: 42 U/L (ref 39–117)
ALT: 43 U/L (ref 0–53)
AST: 36 U/L (ref 0–37)
Albumin: 5.3 g/dL — ABNORMAL HIGH (ref 3.5–5.2)
Anion gap: 9 (ref 5–15)
BUN: 20 mg/dL (ref 6–23)
CALCIUM: 9.9 mg/dL (ref 8.4–10.5)
CO2: 28 mmol/L (ref 19–32)
Chloride: 97 mmol/L (ref 96–112)
Creatinine, Ser: 1.19 mg/dL (ref 0.50–1.35)
GFR calc non Af Amer: 65 mL/min — ABNORMAL LOW (ref >90)
GFR, EST AFRICAN AMERICAN: 75 mL/min — AB (ref >90)
GLUCOSE: 92 mg/dL (ref 70–99)
Potassium: 4.2 mmol/L (ref 3.5–5.1)
SODIUM: 134 mmol/L — AB (ref 135–145)
Total Bilirubin: 1.6 mg/dL — ABNORMAL HIGH (ref 0.3–1.2)
Total Protein: 8 g/dL (ref 6.0–8.3)

## 2014-11-26 LAB — SURGICAL PCR SCREEN
MRSA, PCR: NEGATIVE
Staphylococcus aureus: NEGATIVE

## 2014-11-26 LAB — PROTIME-INR
INR: 1.05 (ref 0.00–1.49)
Prothrombin Time: 13.8 seconds (ref 11.6–15.2)

## 2014-11-26 NOTE — Progress Notes (Signed)
EKG- 11/17/2014 on chart  Dr Leonette MostKalish -LOV 11/17/14 surgical clearance in note and note on chart

## 2014-12-03 ENCOUNTER — Encounter (HOSPITAL_COMMUNITY)
Admission: RE | Disposition: A | Discharge status: Home / Self Care | Payer: Self-pay | Source: Ambulatory Visit | Attending: Orthopedic Surgery

## 2014-12-03 ENCOUNTER — Inpatient Hospital Stay (HOSPITAL_COMMUNITY): Payer: BLUE CROSS/BLUE SHIELD | Admitting: Certified Registered"

## 2014-12-03 ENCOUNTER — Inpatient Hospital Stay (HOSPITAL_COMMUNITY): Payer: BLUE CROSS/BLUE SHIELD

## 2014-12-03 ENCOUNTER — Inpatient Hospital Stay (HOSPITAL_COMMUNITY)
Admission: RE | Admit: 2014-12-03 | Discharge: 2014-12-04 | DRG: 470 | Disposition: A | Discharge status: Home / Self Care | Payer: BLUE CROSS/BLUE SHIELD | Source: Ambulatory Visit | Attending: Orthopedic Surgery | Admitting: Orthopedic Surgery

## 2014-12-03 ENCOUNTER — Encounter (HOSPITAL_COMMUNITY): Payer: Self-pay | Admitting: *Deleted

## 2014-12-03 DIAGNOSIS — M1611 Unilateral primary osteoarthritis, right hip: Secondary | ICD-10-CM

## 2014-12-03 DIAGNOSIS — Z96641 Presence of right artificial hip joint: Secondary | ICD-10-CM | POA: Diagnosis present

## 2014-12-03 DIAGNOSIS — M109 Gout, unspecified: Secondary | ICD-10-CM | POA: Diagnosis present

## 2014-12-03 DIAGNOSIS — I10 Essential (primary) hypertension: Secondary | ICD-10-CM | POA: Diagnosis present

## 2014-12-03 DIAGNOSIS — Z96649 Presence of unspecified artificial hip joint: Secondary | ICD-10-CM

## 2014-12-03 DIAGNOSIS — Z9049 Acquired absence of other specified parts of digestive tract: Secondary | ICD-10-CM | POA: Diagnosis present

## 2014-12-03 DIAGNOSIS — M25551 Pain in right hip: Secondary | ICD-10-CM | POA: Diagnosis present

## 2014-12-03 DIAGNOSIS — Z96642 Presence of left artificial hip joint: Secondary | ICD-10-CM | POA: Diagnosis present

## 2014-12-03 DIAGNOSIS — M169 Osteoarthritis of hip, unspecified: Secondary | ICD-10-CM | POA: Diagnosis present

## 2014-12-03 HISTORY — PX: TOTAL HIP ARTHROPLASTY: SHX124

## 2014-12-03 LAB — ABO/RH: ABO/RH(D): B POS

## 2014-12-03 LAB — TYPE AND SCREEN
ABO/RH(D): B POS
Antibody Screen: NEGATIVE

## 2014-12-03 SURGERY — ARTHROPLASTY, HIP, TOTAL, ANTERIOR APPROACH
Anesthesia: Spinal | Site: Spine Lumbar | Laterality: Right

## 2014-12-03 MED ORDER — FLEET ENEMA 7-19 GM/118ML RE ENEM: 1.0000 | ENEMA | Freq: Once | RECTAL | Status: AC | PRN

## 2014-12-03 MED ORDER — METHOCARBAMOL 1000 MG/10ML IJ SOLN
500.0000 mg | Freq: Four times a day (QID) | INTRAVENOUS | Status: DC | PRN
  Administered 2014-12-03: 500 mg via INTRAVENOUS
  Filled 2014-12-03 (×2): qty 5

## 2014-12-03 MED ORDER — MIDAZOLAM HCL 5 MG/5ML IJ SOLN
INTRAMUSCULAR | Status: DC | PRN
  Administered 2014-12-03: 2 mg via INTRAVENOUS

## 2014-12-03 MED ORDER — SODIUM CHLORIDE 0.9 % IJ SOLN
INTRAMUSCULAR | Status: DC | PRN
  Administered 2014-12-03: 30 mL

## 2014-12-03 MED ORDER — POLYETHYLENE GLYCOL 3350 17 G PO PACK: 17.0000 g | PACK | Freq: Every day | ORAL | Status: DC | PRN

## 2014-12-03 MED ORDER — PROPOFOL 10 MG/ML IV BOLUS
INTRAVENOUS | Status: AC
  Filled 2014-12-03: qty 20

## 2014-12-03 MED ORDER — TRAMADOL HCL 50 MG PO TABS: 50.0000 mg | ORAL_TABLET | Freq: Four times a day (QID) | ORAL | Status: DC | PRN

## 2014-12-03 MED ORDER — ACETAMINOPHEN 500 MG PO TABS
1000.0000 mg | ORAL_TABLET | Freq: Four times a day (QID) | ORAL | Status: AC
  Administered 2014-12-03 – 2014-12-04 (×4): 1000 mg via ORAL
  Filled 2014-12-03 (×5): qty 2

## 2014-12-03 MED ORDER — CEFAZOLIN SODIUM-DEXTROSE 2-3 GM-% IV SOLR
2.0000 g | INTRAVENOUS | Status: AC
  Administered 2014-12-03: 2 g via INTRAVENOUS

## 2014-12-03 MED ORDER — DEXAMETHASONE SODIUM PHOSPHATE 10 MG/ML IJ SOLN: 10.0000 mg | Freq: Once | INTRAMUSCULAR | Status: DC

## 2014-12-03 MED ORDER — DEXAMETHASONE SODIUM PHOSPHATE 10 MG/ML IJ SOLN
10.0000 mg | Freq: Once | INTRAMUSCULAR | Status: AC
  Administered 2014-12-04: 10 mg via INTRAVENOUS
  Filled 2014-12-03: qty 1

## 2014-12-03 MED ORDER — PHENYLEPHRINE 40 MCG/ML (10ML) SYRINGE FOR IV PUSH (FOR BLOOD PRESSURE SUPPORT)
PREFILLED_SYRINGE | INTRAVENOUS | Status: AC
  Filled 2014-12-03: qty 10

## 2014-12-03 MED ORDER — OXYCODONE HCL 5 MG PO TABS
5.0000 mg | ORAL_TABLET | ORAL | Status: DC | PRN
  Administered 2014-12-03 – 2014-12-04 (×6): 10 mg via ORAL
  Filled 2014-12-03 (×6): qty 2

## 2014-12-03 MED ORDER — FENTANYL CITRATE 0.05 MG/ML IJ SOLN
INTRAMUSCULAR | Status: DC | PRN
  Administered 2014-12-03: 50 ug via INTRAVENOUS

## 2014-12-03 MED ORDER — EPHEDRINE SULFATE 50 MG/ML IJ SOLN
INTRAMUSCULAR | Status: AC
  Filled 2014-12-03: qty 1

## 2014-12-03 MED ORDER — SODIUM CHLORIDE 0.9 % IJ SOLN
INTRAMUSCULAR | Status: AC
  Filled 2014-12-03: qty 10

## 2014-12-03 MED ORDER — CHLORHEXIDINE GLUCONATE 4 % EX LIQD: 60.0000 mL | Freq: Once | CUTANEOUS | Status: DC

## 2014-12-03 MED ORDER — MENTHOL 3 MG MT LOZG: 1.0000 | LOZENGE | OROMUCOSAL | Status: DC | PRN

## 2014-12-03 MED ORDER — BUPIVACAINE LIPOSOME 1.3 % IJ SUSP
20.0000 mL | Freq: Once | INTRAMUSCULAR | Status: DC
  Filled 2014-12-03: qty 20

## 2014-12-03 MED ORDER — BISACODYL 10 MG RE SUPP: 10.0000 mg | Freq: Every day | RECTAL | Status: DC | PRN

## 2014-12-03 MED ORDER — LOSARTAN POTASSIUM-HCTZ 100-12.5 MG PO TABS: 1.0000 | ORAL_TABLET | Freq: Every morning | ORAL | Status: DC

## 2014-12-03 MED ORDER — EPHEDRINE SULFATE 50 MG/ML IJ SOLN
INTRAMUSCULAR | Status: DC | PRN
  Administered 2014-12-03 (×3): 15 mg via INTRAVENOUS

## 2014-12-03 MED ORDER — MIDAZOLAM HCL 2 MG/2ML IJ SOLN
INTRAMUSCULAR | Status: AC
  Filled 2014-12-03: qty 2

## 2014-12-03 MED ORDER — BUPIVACAINE HCL (PF) 0.25 % IJ SOLN
INTRAMUSCULAR | Status: AC
  Filled 2014-12-03: qty 30

## 2014-12-03 MED ORDER — TRANEXAMIC ACID 100 MG/ML IV SOLN
1000.0000 mg | INTRAVENOUS | Status: AC
  Administered 2014-12-03: 1000 mg via INTRAVENOUS
  Filled 2014-12-03: qty 10

## 2014-12-03 MED ORDER — METOCLOPRAMIDE HCL 5 MG/ML IJ SOLN: 5.0000 mg | Freq: Three times a day (TID) | INTRAMUSCULAR | Status: DC | PRN

## 2014-12-03 MED ORDER — KETOROLAC TROMETHAMINE 15 MG/ML IJ SOLN: 7.5000 mg | Freq: Four times a day (QID) | INTRAMUSCULAR | Status: AC | PRN

## 2014-12-03 MED ORDER — PHENYLEPHRINE HCL 10 MG/ML IJ SOLN
INTRAMUSCULAR | Status: DC | PRN
  Administered 2014-12-03 (×2): 80 ug via INTRAVENOUS
  Administered 2014-12-03: 200 ug via INTRAVENOUS
  Administered 2014-12-03 (×2): 120 ug via INTRAVENOUS

## 2014-12-03 MED ORDER — LIDOCAINE HCL (CARDIAC) 20 MG/ML IV SOLN
INTRAVENOUS | Status: AC
  Filled 2014-12-03: qty 5

## 2014-12-03 MED ORDER — METOCLOPRAMIDE HCL 10 MG PO TABS: 5.0000 mg | ORAL_TABLET | Freq: Three times a day (TID) | ORAL | Status: DC | PRN

## 2014-12-03 MED ORDER — DIPHENHYDRAMINE HCL 12.5 MG/5ML PO ELIX
12.5000 mg | ORAL_SOLUTION | ORAL | Status: DC | PRN
  Administered 2014-12-04: 25 mg via ORAL
  Filled 2014-12-03: qty 10

## 2014-12-03 MED ORDER — ACETAMINOPHEN 650 MG RE SUPP: 650.0000 mg | Freq: Four times a day (QID) | RECTAL | Status: DC | PRN

## 2014-12-03 MED ORDER — ONDANSETRON HCL 4 MG PO TABS
4.0000 mg | ORAL_TABLET | Freq: Four times a day (QID) | ORAL | Status: DC | PRN
  Administered 2014-12-03 – 2014-12-04 (×2): 4 mg via ORAL
  Filled 2014-12-03 (×2): qty 1

## 2014-12-03 MED ORDER — SODIUM CHLORIDE 0.9 % IV SOLN: INTRAVENOUS | Status: DC

## 2014-12-03 MED ORDER — LOSARTAN POTASSIUM 50 MG PO TABS
100.0000 mg | ORAL_TABLET | Freq: Every day | ORAL | Status: DC
  Filled 2014-12-03: qty 2

## 2014-12-03 MED ORDER — BUPIVACAINE LIPOSOME 1.3 % IJ SUSP
INTRAMUSCULAR | Status: DC | PRN
  Administered 2014-12-03: 20 mL

## 2014-12-03 MED ORDER — BUPIVACAINE HCL (PF) 0.25 % IJ SOLN
INTRAMUSCULAR | Status: DC | PRN
  Administered 2014-12-03: 20 mL

## 2014-12-03 MED ORDER — BUPIVACAINE HCL (PF) 0.5 % IJ SOLN
INTRAMUSCULAR | Status: DC | PRN
  Administered 2014-12-03: 3 mL

## 2014-12-03 MED ORDER — CEFAZOLIN SODIUM-DEXTROSE 2-3 GM-% IV SOLR
INTRAVENOUS | Status: AC
  Filled 2014-12-03: qty 50

## 2014-12-03 MED ORDER — MORPHINE SULFATE 2 MG/ML IJ SOLN
1.0000 mg | INTRAMUSCULAR | Status: DC | PRN
  Administered 2014-12-03: 2 mg via INTRAVENOUS
  Filled 2014-12-03 (×2): qty 1

## 2014-12-03 MED ORDER — PHENOL 1.4 % MT LIQD
1.0000 | OROMUCOSAL | Status: DC | PRN
  Filled 2014-12-03: qty 177

## 2014-12-03 MED ORDER — HYDROCHLOROTHIAZIDE 12.5 MG PO CAPS
12.5000 mg | ORAL_CAPSULE | Freq: Every day | ORAL | Status: DC
  Filled 2014-12-03: qty 1

## 2014-12-03 MED ORDER — CEFAZOLIN SODIUM-DEXTROSE 2-3 GM-% IV SOLR
2.0000 g | Freq: Four times a day (QID) | INTRAVENOUS | Status: AC
  Administered 2014-12-03 – 2014-12-04 (×2): 2 g via INTRAVENOUS
  Filled 2014-12-03 (×2): qty 50

## 2014-12-03 MED ORDER — PROPOFOL 10 MG/ML IV BOLUS
INTRAVENOUS | Status: DC | PRN
  Administered 2014-12-03: 30 mg via INTRAVENOUS

## 2014-12-03 MED ORDER — DOCUSATE SODIUM 100 MG PO CAPS
100.0000 mg | ORAL_CAPSULE | Freq: Two times a day (BID) | ORAL | Status: DC
  Administered 2014-12-03 – 2014-12-04 (×2): 100 mg via ORAL

## 2014-12-03 MED ORDER — PROPOFOL INFUSION 10 MG/ML OPTIME
INTRAVENOUS | Status: DC | PRN
  Administered 2014-12-03: 200 ug/kg/min via INTRAVENOUS

## 2014-12-03 MED ORDER — SODIUM CHLORIDE 0.9 % IV SOLN
INTRAVENOUS | Status: DC
  Administered 2014-12-03: 20:00:00 via INTRAVENOUS

## 2014-12-03 MED ORDER — SODIUM CHLORIDE 0.9 % IJ SOLN
INTRAMUSCULAR | Status: AC
  Filled 2014-12-03: qty 50

## 2014-12-03 MED ORDER — RIVAROXABAN 10 MG PO TABS
10.0000 mg | ORAL_TABLET | Freq: Every day | ORAL | Status: DC
  Administered 2014-12-04: 10 mg via ORAL
  Filled 2014-12-03 (×2): qty 1

## 2014-12-03 MED ORDER — LACTATED RINGERS IV SOLN
INTRAVENOUS | Status: DC
  Administered 2014-12-03: 1000 mL via INTRAVENOUS
  Administered 2014-12-03: 16:00:00 via INTRAVENOUS

## 2014-12-03 MED ORDER — FENTANYL CITRATE 0.05 MG/ML IJ SOLN
INTRAMUSCULAR | Status: AC
  Filled 2014-12-03: qty 2

## 2014-12-03 MED ORDER — ACETAMINOPHEN 10 MG/ML IV SOLN
1000.0000 mg | Freq: Once | INTRAVENOUS | Status: AC
Start: 2014-12-03 — End: 2014-12-03
  Administered 2014-12-03: 1000 mg via INTRAVENOUS
  Filled 2014-12-03: qty 100

## 2014-12-03 MED ORDER — METHOCARBAMOL 500 MG PO TABS: 500.0000 mg | ORAL_TABLET | Freq: Four times a day (QID) | ORAL | Status: DC | PRN

## 2014-12-03 MED ORDER — 0.9 % SODIUM CHLORIDE (POUR BTL) OPTIME
TOPICAL | Status: DC | PRN
  Administered 2014-12-03: 1000 mL

## 2014-12-03 MED ORDER — ONDANSETRON HCL 4 MG/2ML IJ SOLN: 4.0000 mg | Freq: Four times a day (QID) | INTRAMUSCULAR | Status: DC | PRN

## 2014-12-03 MED ORDER — HYDROMORPHONE HCL 1 MG/ML IJ SOLN: 0.2500 mg | INTRAMUSCULAR | Status: DC | PRN

## 2014-12-03 MED ORDER — ACETAMINOPHEN 325 MG PO TABS: 650.0000 mg | ORAL_TABLET | Freq: Four times a day (QID) | ORAL | Status: DC | PRN

## 2014-12-03 SURGICAL SUPPLY — 43 items
BAG DECANTER FOR FLEXI CONT (MISCELLANEOUS) IMPLANT
BAG SPEC THK2 15X12 ZIP CLS (MISCELLANEOUS)
BAG ZIPLOCK 12X15 (MISCELLANEOUS) IMPLANT
BLADE EXTENDED COATED 6.5IN (ELECTRODE) ×3 IMPLANT
BLADE SAG 18X100X1.27 (BLADE) ×3 IMPLANT
CAPT HIP TOTAL 2 ×3 IMPLANT
CLOSURE WOUND 1/2 X4 (GAUZE/BANDAGES/DRESSINGS) ×1
COVER PERINEAL POST (MISCELLANEOUS) ×3 IMPLANT
DECANTER SPIKE VIAL GLASS SM (MISCELLANEOUS) ×3 IMPLANT
DRAPE C-ARM 42X120 X-RAY (DRAPES) ×3 IMPLANT
DRAPE STERI IOBAN 125X83 (DRAPES) ×3 IMPLANT
DRAPE U-SHAPE 47X51 STRL (DRAPES) ×9 IMPLANT
DRSG ADAPTIC 3X8 NADH LF (GAUZE/BANDAGES/DRESSINGS) ×3 IMPLANT
DRSG MEPILEX BORDER 4X4 (GAUZE/BANDAGES/DRESSINGS) ×3 IMPLANT
DRSG MEPILEX BORDER 4X8 (GAUZE/BANDAGES/DRESSINGS) ×3 IMPLANT
DURAPREP 26ML APPLICATOR (WOUND CARE) ×3 IMPLANT
ELECT REM PT RETURN 9FT ADLT (ELECTROSURGICAL) ×3
ELECTRODE REM PT RTRN 9FT ADLT (ELECTROSURGICAL) ×1 IMPLANT
EVACUATOR 1/8 PVC DRAIN (DRAIN) ×3 IMPLANT
FACESHIELD WRAPAROUND (MASK) ×12 IMPLANT
GLOVE BIO SURGEON STRL SZ7.5 (GLOVE) ×3 IMPLANT
GLOVE BIO SURGEON STRL SZ8 (GLOVE) ×3 IMPLANT
GLOVE BIOGEL PI IND STRL 8 (GLOVE) ×2 IMPLANT
GLOVE BIOGEL PI INDICATOR 8 (GLOVE) ×4
GOWN STRL REUS W/TWL LRG LVL3 (GOWN DISPOSABLE) ×3 IMPLANT
GOWN STRL REUS W/TWL XL LVL3 (GOWN DISPOSABLE) ×3 IMPLANT
KIT BASIN OR (CUSTOM PROCEDURE TRAY) ×3 IMPLANT
NDL SAFETY ECLIPSE 18X1.5 (NEEDLE) ×2 IMPLANT
NEEDLE HYPO 18GX1.5 SHARP (NEEDLE) ×6
PACK TOTAL JOINT (CUSTOM PROCEDURE TRAY) ×3 IMPLANT
PEN SKIN MARKING BROAD (MISCELLANEOUS) ×3 IMPLANT
STRIP CLOSURE SKIN 1/2X4 (GAUZE/BANDAGES/DRESSINGS) ×2 IMPLANT
SUT ETHIBOND NAB CT1 #1 30IN (SUTURE) ×3 IMPLANT
SUT MNCRL AB 4-0 PS2 18 (SUTURE) ×3 IMPLANT
SUT VIC AB 2-0 CT1 27 (SUTURE) ×9
SUT VIC AB 2-0 CT1 TAPERPNT 27 (SUTURE) ×3 IMPLANT
SUT VLOC 180 0 24IN GS25 (SUTURE) ×3 IMPLANT
SYR 20CC LL (SYRINGE) ×3 IMPLANT
SYR 50ML LL SCALE MARK (SYRINGE) ×3 IMPLANT
TOWEL OR 17X26 10 PK STRL BLUE (TOWEL DISPOSABLE) ×3 IMPLANT
TOWEL OR NON WOVEN STRL DISP B (DISPOSABLE) ×3 IMPLANT
TRAY FOLEY CATH 16FRSI W/METER (SET/KITS/TRAYS/PACK) ×3 IMPLANT
WATER STERILE IRR 1000ML POUR (IV SOLUTION) ×3 IMPLANT

## 2014-12-03 NOTE — Anesthesia Postprocedure Evaluation (Signed)
  Anesthesia Post-op Note  Patient: Cody Scott  Procedure(s) Performed: Procedure(s) (LRB): RIGHT TOTAL HIP ARTHROPLASTY ANTERIOR APPROACH (Right)  Patient Location: PACU  Anesthesia Type: Spinal  Level of Consciousness: awake and alert   Airway and Oxygen Therapy: Patient Spontanous Breathing  Post-op Pain: mild  Post-op Assessment: Post-op Vital signs reviewed, Patient's Cardiovascular Status Stable, Respiratory Function Stable, Patent Airway and No signs of Nausea or vomiting  Last Vitals:  Filed Vitals:   12/03/14 1800  BP: 124/61  Pulse: 68  Temp: 36.4 C  Resp: 14    Post-op Vital Signs: stable   Complications: No apparent anesthesia complications. Moving both feet in PACU.

## 2014-12-03 NOTE — Anesthesia Procedure Notes (Signed)
Spinal Patient location during procedure: OR Staffing Anesthesiologist: Franne Grip Performed by: anesthesiologist  Preanesthetic Checklist Completed: patient identified, site marked, surgical consent, pre-op evaluation, timeout performed, IV checked, risks and benefits discussed and monitors and equipment checked Spinal Block Patient position: sitting Prep: Betadine Patient monitoring: heart rate, continuous pulse ox and blood pressure Approach: midline Location: L3-4 Injection technique: single-shot Needle Needle type: Sprotte  Needle gauge: 24 G Needle length: 10 cm Additional Notes Expiration date of kit checked and confirmed. Patient tolerated procedure well, without complications. CSF clear. No heme. No paresthesia. Meaningful verbal contact maintained throughout spinal placement.

## 2014-12-03 NOTE — Interval H&P Note (Signed)
History and Physical Interval Note:  12/03/2014 2:40 PM  Cody Scott  has presented today for surgery, with the diagnosis of OSTEOARTHRITIS RIGHT HIP  The various methods of treatment have been discussed with the patient and family. After consideration of risks, benefits and other options for treatment, the patient has consented to  Procedure(s): RIGHT TOTAL HIP ARTHROPLASTY ANTERIOR APPROACH (Right) as a surgical intervention .  The patient's history has been reviewed, patient examined, no change in status, stable for surgery.  I have reviewed the patient's chart and labs.  Questions were answered to the patient's satisfaction.     Loanne DrillingALUISIO,Axel Frisk V

## 2014-12-03 NOTE — H&P (View-Only) (Signed)
Cody Scott DOB: 1956/01/05 Married / Language: English / Race: White Male Date of Admission:  12-03-2014 CC:  Right Hip Pain History of Present Illness The patient is a 59 year old male who comes in for a preoperative History and Physical. The patient is scheduled for a right total hip arthroplasty (anterior approach) to be performed by Dr. Gus RankinFrank V. Aluisio, MD at [facility] on 12-03-2014. The patient is a 59 year old male who presents for follow up of their hip. They are now 6 month(s) out from last evaluation in the office. Symptoms reported today include: pain (along lateral aspect, as well as radiating in to his thigh. The pain sometimes keeps him awake at night. He reports that at his last visit he discussed some loss of range of motion in his hip. He is wondering how much of this might be from his hip, and decided to come in to have the hip evaluated). and report their pain level to be moderate to severe. Current treatment includes: activity modification and NSAIDs (meloxicam 7.5).  Unfortunately, the right hip is getting progressively worse. Pain is in the groin radiating to his knee, and it is limiting what he can and cannot do. He still has a lot of movement in the hip. The pain is mostly in his knee, but the hip itself hurts also. He initially was going to have this evaluated in [place], but decided to get it evaluated here too, as it is more convenient for him.  He is ready to proceed with surgery for the right hip at this time. They have been treated conservatively in the past for the above stated problem and despite conservative measures, they continue to have progressive pain and severe functional limitations and dysfunction. They have failed non-operative management including home exercise, medications, and injections. It is felt that they would benefit from undergoing total joint replacement. Risks and benefits of the procedure have been discussed with the patient and they  elect to proceed with surgery. There are no active contraindications to surgery such as ongoing infection or rapidly progressive neurological disease.   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) High blood pressure Gout  Allergies No Known Drug Allergies  Family History Cancer brother Hypertension mother Depression mother  Social History  Drug/Alcohol Rehab (Currently) no Alcohol use never consumed alcohol Exercise Exercises weekly; does running / walking Drug/Alcohol Rehab (Previously) no Children 2 Number of flights of stairs before winded greater than 5 Marital status married Living situation live with spouse Illicit drug use no Current work status working full time Pain Contract no Tobacco use never smoker Tobacco / smoke exposure no Post-Surgical Plans HOME  Medication History Meloxicam (7.5MG  Tablet, Oral) Active. Losartan Potassium-HCTZ (100-12.5MG  Tablet, Oral) Active.  Past Surgical History  [facility]; [facility] Tonsillectomy Total Knee Replacement right Appendectomy   Review of Systems General Not Present- Chills, Fatigue, Fever, Memory Loss, Night Sweats, Weight Gain and Weight Loss. Skin Not Present- Eczema, Hives, Itching, Lesions and Rash. HEENT Not Present- Dentures, Double Vision, Headache, Hearing Loss, Tinnitus and Visual Loss. Respiratory Not Present- Allergies, Chronic Cough, Coughing up blood, Shortness of breath at rest and Shortness of breath with exertion. Cardiovascular Not Present- Chest Pain, Difficulty Breathing Lying Down, Murmur, Palpitations, Racing/skipping heartbeats and Swelling. Gastrointestinal Not Present- Abdominal Pain, Bloody Stool, Constipation, Diarrhea, Difficulty Swallowing, Heartburn, Jaundice, Loss of appetitie, Nausea and Vomiting. Male Genitourinary Not Present- Blood  in Urine, Discharge,  Flank Pain, Incontinence, Painful Urination, Urgency, Urinary frequency, Urinary Retention, Urinating at Night and Weak urinary stream. Musculoskeletal Present- Joint Pain. Not Present- Back Pain, Joint Swelling, Morning Stiffness, Muscle Pain, Muscle Weakness and Spasms. Neurological Not Present- Blackout spells, Difficulty with balance, Dizziness, Paralysis, Tremor and Weakness. Psychiatric Not Present- Insomnia.   Vitals Weight: 255 lb Height: 73in Weight was reported by patient. Height was reported by patient. Body Surface Area: 2.39 m Body Mass Index: 33.64 kg/m  BP: 138/78 (Sitting, Right Arm, Standard)   Physical Exam General Mental Status -Alert, cooperative and good historian. General Appearance-pleasant, Not in acute distress. Orientation-Oriented X3. Build & Nutrition-Well nourished and Well developed.  Head and Neck Head-normocephalic, atraumatic . Neck Global Assessment - supple, no bruit auscultated on the right, no bruit auscultated on the left.  Eye Vision-Wears contact lenses. Pupil - Bilateral-Regular and Round. Motion - Bilateral-EOMI.  Chest and Lung Exam Auscultation Breath sounds - clear at anterior chest wall and clear at posterior chest wall. Adventitious sounds - No Adventitious sounds.  Cardiovascular Auscultation Rhythm - Regular rate and rhythm. Heart Sounds - S1 WNL and S2 WNL. Murmurs & Other Heart Sounds - Auscultation of the heart reveals - No Murmurs.  Abdomen Palpation/Percussion Tenderness - Abdomen is non-tender to palpation. Rigidity (guarding) - Abdomen is soft. Auscultation Auscultation of the abdomen reveals - Bowel sounds normal.  Male Genitourinary Note: Not done, not pertinent to present illness   Musculoskeletal Note: On examination, he is alert and oriented in no apparent distress. Evaluation of his right hip has flexion to 90, minimal internal rotation to about 20, external rotation 20,  abduction 20. Right knee has no swelling, range is zero to 125. No tenderness or instability. Any knee pain that he is experiencing is reproduced by rotation of his hip.  RADIOGRAPHS: AP pelvis and lateral of the right hip show bone-on-bone arthritis with subchondral cystic formation and osteophyte formation.   Assessment & Plan  Primary osteoarthritis of right hip (M16.11) Note:Surgical Plans: Right Total Hip Repalcement - Anterior Approach  Disposition: Home  PCP: Dr. Leonette MostKalish  IV TXA  Anesthesia Issues: Postop Nausea  Signed electronically by Lauraine RinneAlexzandrew L Trafton Roker, III PA-C

## 2014-12-03 NOTE — Plan of Care (Signed)
Problem: Consults Goal: Diagnosis- Total Joint Replacement  Right THA AA 12/03/2014

## 2014-12-03 NOTE — Transfer of Care (Signed)
Immediate Anesthesia Transfer of Care Note  Patient: Cody HoppingLarry S Laroche  Procedure(s) Performed: Procedure(s): RIGHT TOTAL HIP ARTHROPLASTY ANTERIOR APPROACH (Right)  Patient Location: PACU  Anesthesia Type:Regional and Spinal  Level of Consciousness: awake, sedated and patient cooperative  Airway & Oxygen Therapy: Patient Spontanous Breathing and Patient connected to face mask oxygen  Post-op Assessment: Report given to RN and Post -op Vital signs reviewed and stable  Post vital signs: Reviewed and stable  Last Vitals:  Filed Vitals:   12/03/14 1234  BP: 155/85  Pulse: 77  Temp: 36.6 C  Resp: 18    Complications: No apparent anesthesia complications

## 2014-12-03 NOTE — Op Note (Signed)
OPERATIVE REPORT  PREOPERATIVE DIAGNOSIS: Osteoarthritis of the Right hip.   POSTOPERATIVE DIAGNOSIS: Osteoarthritis of the Right  hip.   PROCEDURE: Right total hip arthroplasty, anterior approach.   SURGEON: Ollen GrossFrank Aziyah Provencal, MD   ASSISTANT: Avel Peacerew Perkins, Pa-C  ANESTHESIA:  Spinal  ESTIMATED BLOOD LOSS:-300 ml  DRAINS: Hemovac x1.   COMPLICATIONS: None   CONDITION: PACU - hemodynamically stable.   BRIEF CLINICAL NOTE: Cody Scott is a 59 y.o. male who has advanced end-  stage arthritis of his Right  hip with progressively worsening pain and  dysfunction.The patient has failed nonoperative management and presents for  total hip arthroplasty.   PROCEDURE IN DETAIL: After successful administration of spinal  anesthetic, the traction boots [facility] were [facility] on both  feet and the [facility] was [facility], boots placed into the leg  holders. The Right hip was then isolated from the perineum with plastic  drapes and prepped and draped in the usual sterile fashion. ASIS and  greater trochanter were marked and a oblique incision was made, starting  at about 1 cm lateral and 2 cm distal to the ASIS and coursing towards  the anterior cortex of the femur. The skin was cut with a 10 blade  through subcutaneous tissue to the level of the fascia overlying the  tensor fascia lata muscle. The fascia was then incised in line with the  incision at the junction of the anterior third and posterior 2/3rd. The  muscle was teased off the fascia and then the interval between the TFL  and the rectus was developed. The Hohmann retractor was then placed at  the top of the femoral neck over the capsule. The vessels overlying the  capsule were cauterized and the fat on top of the capsule was removed.  A Hohmann retractor was then placed anterior underneath the rectus  femoris to give exposure to the entire anterior capsule. A T-shaped  capsulotomy was performed. The  edges were tagged and the femoral head  was identified.       Osteophytes are removed off the superior acetabulum.  The femoral neck was then cut in situ with an oscillating saw. Traction  was then applied to the left lower extremity utilizing the [facility]  traction. The femoral head was then removed. Retractors were placed  around the acetabulum and then circumferential removal of the labrum was  performed. Osteophytes were also removed. Reaming starts at 47 mm to  medialize and  Increased in 2 mm increments to 53 mm. We reamed in  approximately 40 degrees of abduction, 20 degrees anteversion. A 54 mm  pinnacle acetabular shell was then impacted in anatomic position under  fluoroscopic guidance with excellent purchase. We did not need to place  any additional dome screws. A 36 mm neutral + 4 marathon liner was then  placed into the acetabular shell.       The femoral lift was then placed along the lateral aspect of the femur  just distal to the vastus ridge. The leg was  externally rotated and capsule  was stripped off the inferior aspect of the femoral neck down to the  level of the lesser trochanter, this was done with electrocautery. The femur was lifted after this was performed. The  leg was then placed and extended in adducted position to essentially delivering the femur. We also removed the capsule superiorly and the  piriformis from the piriformis fossa  to gain excellent exposure of the  proximal femur. Rongeur was used to remove some cancellous bone to get  into the lateral portion of the proximal femur for placement of the  initial starter reamer. The starter broaches was placed  the starter broach  and was shown to go down the center of the canal. Broaching  with the  Corail system was then performed starting at size 8, coursing  Up to size 13. A size 13 had excellent torsional and rotational  and axial stability. The trial standad offset neck was then placed  with a 36 + 5 trial  head. The hip was then reduced. We confirmed that  the stem was in the canal both on AP and lateral x-rays. It also has excellent sizing. The hip was reduced with outstanding stability through full extension, full external rotation,  and then flexion in adduction internal rotation. AP pelvis was taken  and the leg lengths were measured and found to be exactly equal. Hip  was then dislocated again and the femoral head and neck removed. The  femoral broach was removed. Size 13 Corail stem with a standard offset  neck was then impacted into the femur following native anteversion. Has  excellent purchase in the canal. Excellent torsional and rotational and  axial stability. It is confirmed to be in the canal on AP and lateral  fluoroscopic views. The 36 + 5 ceramic head was placed and the hip  reduced with outstanding stability. Again AP pelvis was taken and it  confirmed that the leg lengths were equal. The wound was then copiously  irrigated with saline solution and the capsule reattached and repaired  with Ethibond suture.  20 mL of Exparel mixed with 50 mL of saline then additional 20 ml of .25% Bupivicaine injected into the capsule and into the edge of the tensor fascia lata as well as subcutaneous tissue. The fascia overlying the tensor fascia lata was  then closed with a running #1 V-Loc. Subcu was closed with interrupted  2-0 Vicryl and subcuticular running 4-0 Monocryl. Incision was cleaned  and dried. Steri-Strips and a bulky sterile dressing applied. Hemovac  drain was hooked to suction and then he was awakened and transported to  recovery in stable condition.        Please note that a surgical assistant was a medical necessity for this procedure to perform it in a safe and expeditious manner. Assistant was necessary to provide appropriate retraction of vital neurovascular structures and to prevent femoral fracture and allow for anatomic placement of the prosthesis.  Ollen Gross, M.D.

## 2014-12-03 NOTE — Plan of Care (Signed)
Problem: Consults Goal: Diagnosis- Total Joint Replacement Right anterior hip     

## 2014-12-03 NOTE — Anesthesia Preprocedure Evaluation (Signed)
Anesthesia Evaluation  Patient identified by MRN, date of birth, ID band Patient awake    Reviewed: Allergy & Precautions, NPO status , Patient's Chart, lab work & pertinent test results  History of Anesthesia Complications (+) PONV and history of anesthetic complications  Airway Mallampati: II  TM Distance: >3 FB Neck ROM: Full    Dental no notable dental hx.    Pulmonary neg pulmonary ROS,  breath sounds clear to auscultation  Pulmonary exam normal       Cardiovascular Exercise Tolerance: Good hypertension, Pt. on medications Rhythm:Regular Rate:Normal     Neuro/Psych negative neurological ROS  negative psych ROS   GI/Hepatic negative GI ROS, Neg liver ROS,   Endo/Other  negative endocrine ROS  Renal/GU negative Renal ROS  negative genitourinary   Musculoskeletal  (+) Arthritis -,   Abdominal (+) + obese,   Peds negative pediatric ROS (+)  Hematology negative hematology ROS (+)   Anesthesia Other Findings   Reproductive/Obstetrics negative OB ROS                             Anesthesia Physical Anesthesia Plan  ASA: II  Anesthesia Plan: Spinal   Post-op Pain Management:    Induction: Intravenous  Airway Management Planned:   Additional Equipment:   Intra-op Plan:   Post-operative Plan:   Informed Consent: I have reviewed the patients History and Physical, chart, labs and discussed the procedure including the risks, benefits and alternatives for the proposed anesthesia with the patient or authorized representative who has indicated his/her understanding and acceptance.   Dental advisory given  Plan Discussed with: CRNA  Anesthesia Plan Comments: (Discussed spinal and general. Discussed risks/benefits of spinal including headache, backache, failure, bleeding, infection, and nerve damage. Patient consents to spinal. Questions answered. Coagulation studies and platelet  count acceptable.)        Anesthesia Quick Evaluation

## 2014-12-04 ENCOUNTER — Encounter (HOSPITAL_COMMUNITY): Payer: Self-pay | Admitting: Orthopedic Surgery

## 2014-12-04 LAB — BASIC METABOLIC PANEL
ANION GAP: 6 (ref 5–15)
BUN: 15 mg/dL (ref 6–23)
CALCIUM: 8.6 mg/dL (ref 8.4–10.5)
CO2: 27 mmol/L (ref 19–32)
CREATININE: 1.12 mg/dL (ref 0.50–1.35)
Chloride: 106 mmol/L (ref 96–112)
GFR calc Af Amer: 81 mL/min — ABNORMAL LOW (ref >90)
GFR calc non Af Amer: 70 mL/min — ABNORMAL LOW (ref >90)
Glucose, Bld: 144 mg/dL — ABNORMAL HIGH (ref 70–99)
Potassium: 3.9 mmol/L (ref 3.5–5.1)
SODIUM: 139 mmol/L (ref 135–145)

## 2014-12-04 LAB — CBC
HCT: 36.4 % — ABNORMAL LOW (ref 39.0–52.0)
Hemoglobin: 12.9 g/dL — ABNORMAL LOW (ref 13.0–17.0)
MCH: 32.4 pg (ref 26.0–34.0)
MCHC: 35.4 g/dL (ref 30.0–36.0)
MCV: 91.5 fL (ref 78.0–100.0)
PLATELETS: 174 10*3/uL (ref 150–400)
RBC: 3.98 MIL/uL — ABNORMAL LOW (ref 4.22–5.81)
RDW: 13 % (ref 11.5–15.5)
WBC: 7.7 10*3/uL (ref 4.0–10.5)

## 2014-12-04 MED ORDER — TRAMADOL HCL 50 MG PO TABS: 50.0000 mg | ORAL_TABLET | Freq: Four times a day (QID) | ORAL | Status: AC | PRN

## 2014-12-04 MED ORDER — METHOCARBAMOL 500 MG PO TABS: 500.0000 mg | ORAL_TABLET | Freq: Four times a day (QID) | ORAL | Status: AC | PRN

## 2014-12-04 MED ORDER — OXYCODONE HCL 5 MG PO TABS: 5.0000 mg | ORAL_TABLET | ORAL | Status: AC | PRN

## 2014-12-04 MED ORDER — RIVAROXABAN 10 MG PO TABS: 10.0000 mg | ORAL_TABLET | Freq: Every day | ORAL | Status: AC

## 2014-12-04 MED ORDER — ALUM & MAG HYDROXIDE-SIMETH 200-200-20 MG/5ML PO SUSP
30.0000 mL | ORAL | Status: DC | PRN
  Administered 2014-12-04: 30 mL via ORAL
  Filled 2014-12-04: qty 30

## 2014-12-04 NOTE — Care Management Note (Signed)
    Page 1 of 1   12/04/2014     11:48:23 AM CARE MANAGEMENT NOTE 12/04/2014  Patient:  Cody Scott, Cody Scott   Account Number:  [id number]  Date Initiated:  12/04/2014  Documentation initiated by:  [facility]  Subjective/Objective Assessment:   adm: RIGHT TOTAL HIP ARTHROPLASTY ANTERIOR APPROACH (Right)     Action/Plan:   discharge planning   Anticipated DC Date:  12/04/2014   Anticipated DC Plan:  [place]  CM consult      [facility] Choice  HOME HEALTH   Choice offered to / List presented to:  C-1 Patient        [place] arranged  HH-2 PT      [place]   Status of service:  Completed, signed off Medicare Important Message given?   (If response is "NO", the following Medicare IM given date fields will be blank) Date Medicare IM given:   Medicare IM given by:   Date Additional Medicare IM given:   Additional Medicare IM given by:    Discharge Disposition:  [place]  Per UR Regulation:  Reviewed for med. necessity/level of care/duration of stay  If discussed at [place] of Stay Meetings, dates discussed:    Comments:  12/04/14 08:00 CM met with pt in room to offer choice of home health agency. Pt chooses [facility] to render HHPT. Address and contact information verified by pt.  CM gave referral to Shaune Leeks (on unit).  No other CM needs were communicated.  Mariane Masters, BSN, CM [phone].

## 2014-12-04 NOTE — Evaluation (Signed)
Physical Therapy Evaluation Patient Details Name: Cody Scott MRN: [id number] DOB: 1956/02/12 Today's Date: 12/04/2014   History of Present Illness  R THR  Clinical Impression  Pt s/p R THR presents with decreased R LE strength/ROM and post op pain limiting functional mobility.  Pt should progress to dc home with family assist and HHPT follow up.    Follow Up Recommendations Home health PT    Equipment Recommendations  None recommended by PT    Recommendations for Other Services OT consult     Precautions / Restrictions Precautions Precautions: Fall Restrictions Weight Bearing Restrictions: No Other Position/Activity Restrictions: WBAT      Mobility  Bed Mobility Overal bed mobility: Needs Assistance Bed Mobility: Supine to Sit     Supine to sit: Min guard     General bed mobility comments: cues for sequence and use of R LE to self assist  Transfers Overall transfer level: Needs assistance Equipment used: Rolling walker (2 wheeled) Transfers: Sit to/from Stand Sit to Stand: Min guard         General transfer comment: min cues for transition position and use of UEs to self assist  Ambulation/Gait Ambulation/Gait assistance: Min assist;Min guard Ambulation Distance (Feet): 200 Feet Assistive device: Rolling walker (2 wheeled) Gait Pattern/deviations: Step-to pattern;Step-through pattern;Decreased step length - right;Decreased step length - left;Shuffle Gait velocity: decr   General Gait Details: cues for posture, position from RW and initial sequence  Stairs            Wheelchair Mobility    Modified Rankin (Stroke Patients Only)       Balance                                             Pertinent Vitals/Pain Pain Assessment: 0-10 Pain Score: 4  Pain Location: R hip/thigh Pain Descriptors / Indicators: Aching;Burning Pain Intervention(s): Limited activity within patient's tolerance;Monitored during  session;Premedicated before session;Ice applied    Home Living Family/patient expects to be discharged to:: Private residence Living Arrangements: Spouse/significant other Available Help at Discharge: Family Type of Home: House Home Access: Stairs to enter Entrance Stairs-Rails: None Entrance Stairs-Number of Steps: 2 Home Layout: Two level Home Equipment: Environmental consultantWalker - 2 wheels;Crutches;Bedside commode      Prior Function Level of Independence: Independent               Hand Dominance   Dominant Hand: Right    Extremity/Trunk Assessment   Upper Extremity Assessment: Overall WFL for tasks assessed           Lower Extremity Assessment: RLE deficits/detail RLE Deficits / Details: 3-/5 hip strength with AAROM at hip to 95 flex and 20 abd    Cervical / Trunk Assessment: Normal  Communication   Communication: No difficulties  Cognition Arousal/Alertness: Awake/alert Behavior During Therapy: WFL for tasks assessed/performed Overall Cognitive Status: Within Functional Limits for tasks assessed                      General Comments      Exercises Total Joint Exercises Ankle Circles/Pumps: AROM;Both;15 reps;Supine Quad Sets: AROM;Both;10 reps;Supine Heel Slides: AAROM;Right;15 reps;Supine Hip ABduction/ADduction: AAROM;Right;15 reps;Supine      Assessment/Plan    PT Assessment Patient needs continued PT services  PT Diagnosis Difficulty walking   PT Problem List Decreased strength;Decreased range of motion;Decreased activity tolerance;Decreased mobility;Decreased knowledge of  use of DME;Pain  PT Treatment Interventions DME instruction;Gait training;Stair training;Functional mobility training;Therapeutic activities;Therapeutic exercise;Patient/family education   PT Goals (Current goals can be found in the Care Plan section) Acute Rehab PT Goals Patient Stated Goal: Resume previous lifestyle with decreased pain PT Goal Formulation: With patient Time For  Goal Achievement: 12/11/14 Potential to Achieve Goals: Good    Frequency 7X/week   Barriers to discharge        Co-evaluation               End of Session Equipment Utilized During Treatment: Gait belt Activity Tolerance: Patient tolerated treatment well Patient left: in chair;with call bell/phone within reach;with family/visitor present Nurse Communication: Mobility status         Time: 1610-9604 PT Time Calculation (min) (ACUTE ONLY): 29 min   Charges:   PT Evaluation $Initial PT Evaluation Tier I: 1 Procedure PT Treatments $Therapeutic Exercise: 8-22 mins   PT G Codes:        [name] 2014-12-11, 10:28 AM

## 2014-12-04 NOTE — Progress Notes (Signed)
OT Cancellation Note  Patient Details Name: Myrtis HoppingLarry S Bazzi MRN: [id number] DOB: June 04, 1956   Cancelled Treatment:    Reason Eval/Treat Not Completed: OT screened, no needs identified, will sign off. Pt reports several previous hip surgeries and feels comfortable with all ADL. He has all DME and wife available to assist. Will sign off.  Lennox LaityStone, Jatoria Kneeland Stafford  [phone] 12/04/2014, 12:34 PM

## 2014-12-04 NOTE — Discharge Instructions (Addendum)
°Dr. Frank Aluisio °Total Joint Specialist °[facility] °[address] °[place], [place] [address] °[phone] ° ° ° °ANTERIOR APPROACH TOTAL HIP REPLACEMENT POSTOPERATIVE DIRECTIONS ° ° °Hip Rehabilitation, Guidelines Following Surgery  °The results of a hip operation are greatly improved after range of motion and muscle strengthening exercises. Follow all safety measures which are given to protect your hip. If any of these exercises cause increased pain or swelling in your joint, decrease the amount until you are comfortable again. Then slowly increase the exercises. Call your caregiver if you have problems or questions.  °HOME CARE INSTRUCTIONS  °Most of the following instructions are designed to prevent the dislocation of your new hip.  °Remove items at home which could result in a fall. This includes throw rugs or furniture in walking pathways.  °Continue medications as instructed at time of discharge. °· You may have some home medications which will be placed on hold until you complete the course of blood thinner medication. °· You may start showering once you are discharged home but do not submerge the incision under water. Just pat the incision dry and apply a dry gauze dressing on daily. °Do not put on socks or shoes without following the instructions of your caregivers.  °Sit on high chairs which makes it easier to stand.  °Sit on chairs with arms. Use the chair arms to help push yourself up when arising.  °Keep your leg on the side of the operation out in front of you when standing up.  °Arrange for the use of a toilet seat elevator so you are not sitting low.   °· Walk with walker as instructed.  °You may resume a sexual relationship in one month or when given the OK by your caregiver.  °Use walker as long as suggested by your caregivers.  °You may put full weight on your legs and walk as much as is comfortable. °Avoid periods of inactivity such as sitting longer than an hour  when not asleep. This helps prevent blood clots.  °You may return to work once you are cleared by your surgeon.  °Do not drive a car for 6 weeks or until released by your surgeon.  °Do not drive while taking narcotics.  °Wear elastic stockings for three weeks following surgery during the day but you may remove then at night.  °Make sure you keep all of your appointments after your operation with all of your doctors and caregivers. You should call the office at the above phone number and make an appointment for approximately two weeks after the date of your surgery. °Change the dressing daily and reapply a dry dressing each time. °Please pick up a stool softener and laxative for home use as long as you are requiring pain medications. °· ICE to the affected hip every three hours for 30 minutes at a time and then as needed for pain and swelling.  Continue to use ice on the hip for pain and swelling from surgery. You may notice swelling that will progress down to the foot and ankle.  This is normal after surgery.  Elevate the leg when you are not up walking on it.   °It is important for you to complete the blood thinner medication as prescribed by your doctor. °· Continue to use the breathing machine which will help keep your temperature down.  It is common for your temperature to cycle up and down following surgery, especially at night when you are not up moving around   and exerting yourself.  The breathing machine keeps your lungs expanded and your temperature down. ° °RANGE OF MOTION AND STRENGTHENING EXERCISES  °These exercises are designed to help you keep full movement of your hip joint. Follow your caregiver's or physical therapist's instructions. Perform all exercises about fifteen times, three times per day or as directed. Exercise both hips, even if you have had only one joint replacement. These exercises can be done on a training (exercise) mat, on the floor, on a table or on a bed. Use whatever works the best  and is most comfortable for you. Use music or television while you are exercising so that the exercises are a pleasant break in your day. This will make your life better with the exercises acting as a break in routine you can look forward to.  °Lying on your back, slowly slide your foot toward your buttocks, raising your knee up off the floor. Then slowly slide your foot back down until your leg is straight again.  °Lying on your back spread your legs as far apart as you can without causing discomfort.  °Lying on your side, raise your upper leg and foot straight up from the floor as far as is comfortable. Slowly lower the leg and repeat.  °Lying on your back, tighten up the muscle in the front of your thigh (quadriceps muscles). You can do this by keeping your leg straight and trying to raise your heel off the floor. This helps strengthen the largest muscle supporting your knee.  °Lying on your back, tighten up the muscles of your buttocks both with the legs straight and with the knee bent at a comfortable angle while keeping your heel on the floor.  ° °SKILLED REHAB INSTRUCTIONS: °If the patient is transferred to a skilled rehab facility following release from the hospital, a list of the current medications will be sent to the facility for the patient to continue.  When discharged from the skilled rehab facility, please have the facility set up the patient's Home Health Physical Therapy prior to being released. Also, the skilled facility will be responsible for providing the patient with their medications at time of release from the facility to include their pain medication, the muscle relaxants, and their blood thinner medication. If the patient is still at the rehab facility at time of the two week follow up appointment, the skilled rehab facility will also need to assist the patient in arranging follow up appointment in our office and any transportation needs. ° °MAKE SURE YOU:  °Understand these instructions.    °Will watch your condition.  °Will get help right away if you are not doing well or get worse. ° °Pick up stool softner and laxative for home use following surgery while on pain medications. °Do not submerge incision under water. °Please use good hand washing techniques while changing dressing each day. °May shower starting three days after surgery. °Please use a clean towel to pat the incision dry following showers. °Continue to use ice for pain and swelling after surgery. °Do not use any lotions or creams on the incision until instructed by your surgeon. °Total Hip Protocol. ° °Take Xarelto for two and a half more weeks, then discontinue Xarelto. °Once the patient has completed the blood thinner regimen, then take a Baby 81 mg Aspirin daily for three more weeks. ° ° °Postoperative Constipation Protocol ° °Constipation - defined medically as fewer than three stools per week and severe constipation as less than one stool per week. ° °  One of the most common issues patients have following surgery is constipation.  Even if you have a regular bowel pattern at home, your normal regimen is likely to be disrupted due to multiple reasons following surgery.  Combination of anesthesia, postoperative narcotics, change in appetite and fluid intake all can affect your bowels.  In order to avoid complications following surgery, here are some recommendations in order to help you during your recovery period. ° °Colace (docusate) - Pick up an over-the-counter form of Colace or another stool softener and take twice a day as long as you are requiring postoperative pain medications.  Take with a full glass of water daily.  If you experience loose stools or diarrhea, hold the colace until you stool forms back up.  If your symptoms do not get better within 1 week or if they get worse, check with your doctor. ° °Dulcolax (bisacodyl) - Pick up over-the-counter and take as directed by the product packaging as needed to assist with the  movement of your bowels.  Take with a full glass of water.  Use this product as needed if not relieved by Colace only.  ° °MiraLax (polyethylene glycol) - Pick up over-the-counter to have on hand.  MiraLax is a solution that will increase the amount of water in your bowels to assist with bowel movements.  Take as directed and can mix with a glass of water, juice, soda, coffee, or tea.  Take if you go more than two days without a movement. °Do not use MiraLax more than once per day. Call your doctor if you are still constipated or irregular after using this medication for 7 days in a row. ° °If you continue to have problems with postoperative constipation, please contact the office for further assistance and recommendations.  If you experience "the worst abdominal pain ever" or develop nausea or vomiting, please contact the office immediatly for further recommendations for treatment. ° ° °Information on my medicine - XARELTO® (Rivaroxaban) ° °This medication education was reviewed with me or my healthcare representative as part of my discharge preparation.  The pharmacist that spoke with me during my hospital stay was:  Danaisha Celli A, RPH ° °Why was Xarelto® prescribed for you? °Xarelto® was prescribed for you to reduce the risk of blood clots forming after orthopedic surgery. The medical term for these abnormal blood clots is venous thromboembolism (VTE). ° °What do you need to know about xarelto® ? °Take your Xarelto® ONCE DAILY at the same time every day. °You may take it either with or without food. ° °If you have difficulty swallowing the tablet whole, you may crush it and mix in applesauce just prior to taking your dose. ° °Take Xarelto® exactly as prescribed by your doctor and DO NOT stop taking Xarelto® without talking to the doctor who prescribed the medication.  Stopping without other VTE prevention medication to take the place of Xarelto® may increase your risk of developing a clot. ° °After discharge,  you should have regular check-up appointments with your healthcare provider that is prescribing your Xarelto®.   ° °What do you do if you miss a dose? °If you miss a dose, take it as soon as you remember on the same day then continue your regularly scheduled once daily regimen the next day. Do not take two doses of Xarelto® on the same day.  ° °Important Safety Information °A possible side effect of Xarelto® is bleeding. You should call your healthcare provider right away if you experience   any of the following: °? Bleeding from an injury or your nose that does not stop. °? Unusual colored urine (red or dark brown) or unusual colored stools (red or black). °? Unusual bruising for unknown reasons. °? A serious fall or if you hit your head (even if there is no bleeding). ° °Some medicines may interact with Xarelto® and might increase your risk of bleeding while on Xarelto®. To help avoid this, consult your healthcare provider or pharmacist prior to using any new prescription or non-prescription medications, including herbals, vitamins, non-steroidal anti-inflammatory drugs (NSAIDs) and supplements. ° °This website has more information on Xarelto®: www.xarelto.com. ° ° ° °

## 2014-12-04 NOTE — Progress Notes (Signed)
   Subjective: 1 Day Post-Op Procedure(s) (LRB): RIGHT TOTAL HIP ARTHROPLASTY ANTERIOR APPROACH (Right) Patient reports pain as mild.   Patient seen in rounds with Dr. Lequita HaltAluisio. He is sitting up in bed and doing well this morning. Patient is well, and has had no acute complaints or problems.  Will get up with therapy today and if does well with two sessions, then possibly home later his afternoon.    Objective: Vital signs in last 24 hours: Temp:  [97.2 F (36.2 C)-99.3 F (37.4 C)] 98.2 F (36.8 C) (03/10 0627) Pulse Rate:  [64-83] 76 (03/10 0627) Resp:  [11-18] 16 (03/10 0627) BP: (93-155)/(55-85) 116/62 mmHg (03/10 0627) SpO2:  [96 %-100 %] 99 % (03/10 0627) FiO2 (%):  [2 %] 2 % (03/09 1915) Weight:  [114.76 kg (253 lb)] 114.76 kg (253 lb) (03/09 1815)  Intake/Output from previous day:  Intake/Output Summary (Last 24 hours) at 12/04/14 0838 Last data filed at 12/04/14 [id number]  Gross per 24 hour  Intake 4380.01 ml  Output   1955 ml  Net 2425.01 ml    Intake/Output this shift: UOP 675 since around [place]  Labs:  Recent Labs  12/04/14 0505  HGB 12.9*    Recent Labs  12/04/14 0505  WBC 7.7  RBC 3.98*  HCT 36.4*  PLT 174    Recent Labs  12/04/14 0505  NA 139  K 3.9  CL 106  CO2 27  BUN 15  CREATININE 1.12  GLUCOSE 144*  CALCIUM 8.6   No results for input(s): LABPT, INR in the last 72 hours.  EXAM: General - Patient is Alert, Appropriate and Oriented Extremity - Neurovascular intact Sensation intact distally Dorsiflexion/Plantar flexion intact Dressing - clean, dry, no drainage.  Hemovac pulled without difficulty Motor Function - intact, moving foot and toes well on exam.   Assessment/Plan: 1 Day Post-Op Procedure(s) (LRB): RIGHT TOTAL HIP ARTHROPLASTY ANTERIOR APPROACH (Right) Procedure(s) (LRB): RIGHT TOTAL HIP ARTHROPLASTY ANTERIOR APPROACH (Right) Past Medical History  Diagnosis Date  . PONV (postoperative nausea and vomiting)   . Hypertension    . Arthritis    Principal Problem:   OA (osteoarthritis) of hip  Estimated body mass index is 33.39 kg/(m^2) as calculated from the following:   Height as of this encounter: 6\' 1"  (1.854 m).   Weight as of this encounter: 114.76 kg (253 lb). Up with therapy Discharge home with home health if does well with therapy on two sessions. Diet - Cardiac diet Follow up - in 2 weeks Activity - WBAT Disposition - Home Condition Upon Discharge - pending.  Home if well with therapy D/C Meds - See DC Summary DVT Prophylaxis - Xarelto  Avel Peacerew Perkins, PA-C Orthopaedic Surgery 12/04/2014, 8:38 AM

## 2014-12-04 NOTE — Progress Notes (Signed)
Physical Therapy Treatment Patient Details Name: Cody Scott MRN: [id number] DOB: [birth year] Today's Date: 12/04/2014    History of Present Illness R THR    PT Comments    Pt progressing well and eager for dc.  Reviewed car transfers, stairs and fitting crutches (pt is borrowing pair).  Follow Up Recommendations  Home health PT     Equipment Recommendations  None recommended by PT    Recommendations for Other Services OT consult     Precautions / Restrictions Precautions Precautions: Fall Restrictions Weight Bearing Restrictions: No Other Position/Activity Restrictions: WBAT    Mobility  Bed Mobility Overal bed mobility: Needs Assistance Bed Mobility: Sit to Supine       Sit to supine: Supervision   General bed mobility comments: cues for sequence and use of R LE to self assist  Transfers Overall transfer level: Needs assistance Equipment used: Rolling walker (2 wheeled) Transfers: Sit to/from Stand Sit to Stand: Supervision         General transfer comment: min cues for transition position and use of UEs to self assist  Ambulation/Gait Ambulation/Gait assistance: Supervision Ambulation Distance (Feet): 200 Feet Assistive device: Rolling walker (2 wheeled) Gait Pattern/deviations: Step-to pattern;Step-through pattern;Decreased step length - right;Decreased step length - left;Shuffle;Trunk flexed Gait velocity: decr   General Gait Details: cues for posture, position from RW and initial sequence   Stairs Stairs: Yes Stairs assistance: Min guard Stair Management: No rails;One rail Right;Step to pattern;Forwards;With crutches Number of Stairs: 10 General stair comments: min cues for sequence and foot/crutch placement.  Down 10 and up 6 stairs with crutch and rail.  Up 4 stairs with bil crutches  Wheelchair Mobility    Modified Rankin (Stroke Patients Only)       Balance                                    Cognition  Arousal/Alertness: Awake/alert Behavior During Therapy: WFL for tasks assessed/performed Overall Cognitive Status: Within Functional Limits for tasks assessed                      Exercises      General Comments        Pertinent Vitals/Pain Pain Assessment: 0-10 Pain Score: 4  Pain Location: R hip Pain Descriptors / Indicators: Aching;Sore Pain Intervention(s): Limited activity within patient's tolerance;Premedicated before session;Monitored during session    Home Living                      Prior Function            PT Goals (current goals can now be found in the care plan section) Acute Rehab PT Goals Patient Stated Goal: Resume previous lifestyle with decreased pain PT Goal Formulation: With patient Time For Goal Achievement: 12/11/14 Potential to Achieve Goals: Good Progress towards PT goals: Progressing toward goals    Frequency  7X/week    PT Plan Current plan remains appropriate    Co-evaluation             End of Session Equipment Utilized During Treatment: Gait belt Activity Tolerance: Patient tolerated treatment well Patient left: in bed;with call bell/phone within reach;with family/visitor present     Time: 1325-1350 PT Time Calculation (min) (ACUTE ONLY): 25 min  Charges:  $Gait Training: 8-22 mins $Therapeutic Activity: 8-22 mins  G Codes:      Cody Scott 12/04/2014, 3:09 PM

## 2014-12-04 NOTE — Discharge Summary (Signed)
Physician Discharge Summary   Patient ID: Cody Scott MRN: [id number] DOB/AGE: 11-30-1955 59 y.o.  Admit date: 12/03/2014 Discharge date: 12/04/2014  Primary Diagnosis:  Osteoarthritis of the Right hip.  Admission Diagnoses:  Past Medical History  Diagnosis Date  . PONV (postoperative nausea and vomiting)   . Hypertension   . Arthritis    Discharge Diagnoses:   Principal Problem:   OA (osteoarthritis) of hip  Estimated body mass index is 33.39 kg/(m^2) as calculated from the following:   Height as of this encounter: _0  (1.854 m).   Weight as of this encounter: 114.76 kg (253 lb).  Procedure(s) (LRB): RIGHT TOTAL HIP ARTHROPLASTY ANTERIOR APPROACH (Right)   Consults: None  HPI: Cody Scott is a 59 y.o. male who has advanced end-  stage arthritis of his Right hip with progressively worsening pain and  dysfunction.The patient has failed nonoperative management and presents for  total hip arthroplasty.   Laboratory Data: Admission on 12/03/2014, Discharged on 12/04/2014  Component Date Value Ref Range Status  . ABO/RH(D) 12/03/2014 B POS   Final  . Antibody Screen 12/03/2014 NEG   Final  . Sample Expiration 12/03/2014 12/06/2014   Final  . ABO/RH(D) 12/03/2014 B POS   Final  . WBC 12/04/2014 7.7  4.0 - 10.5 K/uL Final  . RBC 12/04/2014 3.98* 4.22 - 5.81 MIL/uL Final  . Hemoglobin 12/04/2014 12.9* 13.0 - 17.0 g/dL Final  . HCT 12/04/2014 36.4* 39.0 - 52.0 % Final  . MCV 12/04/2014 91.5  78.0 - 100.0 fL Final  . MCH 12/04/2014 32.4  26.0 - 34.0 pg Final  . MCHC 12/04/2014 35.4  30.0 - 36.0 g/dL Final  . RDW 12/04/2014 13.0  11.5 - 15.5 % Final  . Platelets 12/04/2014 174  150 - 400 K/uL Final  . Sodium 12/04/2014 139  135 - 145 mmol/L Final  . Potassium 12/04/2014 3.9  3.5 - 5.1 mmol/L Final  . Chloride 12/04/2014 106  96 - 112 mmol/L Final  . CO2 12/04/2014 27  19 - 32 mmol/L Final  . Glucose, Bld 12/04/2014 144* 70 - 99 mg/dL Final  . BUN 12/04/2014 15  6  - 23 mg/dL Final  . Creatinine, Ser 12/04/2014 1.12  0.50 - 1.35 mg/dL Final  . Calcium 12/04/2014 8.6  8.4 - 10.5 mg/dL Final  . GFR calc non Af Amer 12/04/2014 70* >90 mL/min Final  . GFR calc Af Amer 12/04/2014 81* >90 mL/min Final   Comment: (NOTE) The eGFR has been calculated using the CKD EPI equation. This calculation has not been validated in all clinical situations. eGFR's persistently <90 mL/min signify possible Chronic Kidney Disease.   Georgiann Hahn gap 12/04/2014 6  5 - 15 Final  Hospital Outpatient Visit on 11/26/2014  Component Date Value Ref Range Status  . aPTT 11/26/2014 36  24 - 37 seconds Final  . WBC 11/26/2014 5.8  4.0 - 10.5 K/uL Final  . RBC 11/26/2014 5.26  4.22 - 5.81 MIL/uL Final  . Hemoglobin 11/26/2014 16.7  13.0 - 17.0 g/dL Final  . HCT 11/26/2014 47.3  39.0 - 52.0 % Final  . MCV 11/26/2014 89.9  78.0 - 100.0 fL Final  . MCH 11/26/2014 31.7  26.0 - 34.0 pg Final  . MCHC 11/26/2014 35.3  30.0 - 36.0 g/dL Final  . RDW 11/26/2014 13.1  11.5 - 15.5 % Final  . Platelets 11/26/2014 202  150 - 400 K/uL Final  . Sodium 11/26/2014 134* 135 - 145 mmol/L  Final  . Potassium 11/26/2014 4.2  3.5 - 5.1 mmol/L Final  . Chloride 11/26/2014 97  96 - 112 mmol/L Final  . CO2 11/26/2014 28  19 - 32 mmol/L Final  . Glucose, Bld 11/26/2014 92  70 - 99 mg/dL Final  . BUN 11/26/2014 20  6 - 23 mg/dL Final  . Creatinine, Ser 11/26/2014 1.19  0.50 - 1.35 mg/dL Final  . Calcium 11/26/2014 9.9  8.4 - 10.5 mg/dL Final  . Total Protein 11/26/2014 8.0  6.0 - 8.3 g/dL Final  . Albumin 11/26/2014 5.3* 3.5 - 5.2 g/dL Final  . AST 11/26/2014 36  0 - 37 U/L Final  . ALT 11/26/2014 43  0 - 53 U/L Final  . Alkaline Phosphatase 11/26/2014 42  39 - 117 U/L Final  . Total Bilirubin 11/26/2014 1.6* 0.3 - 1.2 mg/dL Final  . GFR calc non Af Amer 11/26/2014 65* >90 mL/min Final  . GFR calc Af Amer 11/26/2014 75* >90 mL/min Final   Comment: (NOTE) The eGFR has been calculated using the CKD EPI  equation. This calculation has not been validated in all clinical situations. eGFR's persistently <90 mL/min signify possible Chronic Kidney Disease.   . Anion gap 11/26/2014 9  5 - 15 Final  . Prothrombin Time 11/26/2014 13.8  11.6 - 15.2 seconds Final  . INR 11/26/2014 1.05  0.00 - 1.49 Final  . Color, Urine 11/26/2014 YELLOW  YELLOW Final  . APPearance 11/26/2014 CLEAR  CLEAR Final  . Specific Gravity, Urine 11/26/2014 1.007  1.005 - 1.030 Final  . pH 11/26/2014 6.0  5.0 - 8.0 Final  . Glucose, UA 11/26/2014 NEGATIVE  NEGATIVE mg/dL Final  . Hgb urine dipstick 11/26/2014 NEGATIVE  NEGATIVE Final  . Bilirubin Urine 11/26/2014 NEGATIVE  NEGATIVE Final  . Ketones, ur 11/26/2014 NEGATIVE  NEGATIVE mg/dL Final  . Protein, ur 11/26/2014 NEGATIVE  NEGATIVE mg/dL Final  . Urobilinogen, UA 11/26/2014 0.2  0.0 - 1.0 mg/dL Final  . Nitrite 11/26/2014 NEGATIVE  NEGATIVE Final  . Leukocytes, UA 11/26/2014 NEGATIVE  NEGATIVE Final   MICROSCOPIC NOT DONE ON URINES WITH NEGATIVE PROTEIN, BLOOD, LEUKOCYTES, NITRITE, OR GLUCOSE <1000 mg/dL.  Marland Kitchen MRSA, PCR 11/26/2014 NEGATIVE  NEGATIVE Final  . Staphylococcus aureus 11/26/2014 NEGATIVE  NEGATIVE Final   Comment:        The Xpert SA Assay (FDA approved for NASAL specimens in patients over 75 years of age), is one component of a comprehensive surveillance program.  Test performance has been validated by [facility] for patients greater than or equal to 49 year old. It is not intended to diagnose infection nor to guide or monitor treatment.      X-Rays:Dg Pelvis Portable  12/03/2014   CLINICAL DATA:  Right total hip replacement.  EXAM: DG C-ARM 1-60 MIN - NRPT [facility]; PORTABLE PELVIS 1-2 VIEWS  COMPARISON:  12/29/2008.  FINDINGS: A portable frontal radiograph demonstrates bilateral hip prostheses in place. The acetabular shell component of the left prosthesis has fixation screws and there is a left acetabular roof lucent lesion probably representing a  subcortical cyst or geode, and questionable left protrusio. The femoral implant on the left appears unremarkable.  On the right, acetabular shell and femoral implant appear unremarkable.  IMPRESSION: 1. Bilateral hip implants observed without specific complicating feature. 2. 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   Electronically Signed   By: Van Clines  M.D.   On: 12/03/2014 18:40   Dg C-arm 1-60 Min-no Report  12/03/2014   CLINICAL DATA:  Right total hip replacement.  EXAM: DG C-ARM 1-60 MIN - [facility]PT [facility]; PORTABLE PELVIS 1-2 VIEWS  COMPARISON:  12/29/2008.  FINDINGS: A portable frontal radiograph demonstrates bilateral hip prostheses in place. The acetabular shell component of the left prosthesis has fixation screws and there is a left acetabular roof lucent lesion probably representing a subcortical cyst or geode, and questionable left protrusio. The femoral implant on the left appears unremarkable.  On the right, acetabular shell and femoral implant appear unremarkable.  IMPRESSION: 1. Bilateral hip implants observed without specific complicating feature. 2. 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   Electronically Signed   By: Van Clines M.D.   On: 12/03/2014 18:40    EKG:No orders found for this or any previous visit.   Hospital Course: Patient was admitted to [facility] and taken to the OR and underwent the above state procedure without complications.  Patient tolerated the procedure well and was later transferred to the recovery room and then to the orthopaedic floor for postoperative care.  They were given PO and IV analgesics for pain control following their surgery.  They were given 24 hours of postoperative antibiotics of  Anti-infectives    Start     Dose/Rate Route Frequency  Ordered Stop   12/03/14 2100  ceFAZolin (ANCEF) IVPB 2 g/50 mL premix     2 g 100 mL/hr over 30 Minutes Intravenous Every 6 hours 12/03/14 1811 12/04/14 0353   12/03/14 1245  ceFAZolin (ANCEF) IVPB 2 g/50 mL premix     2 g 100 mL/hr over 30 Minutes Intravenous 60 min pre-op 12/03/14 1242 12/03/14 1452     and started on DVT prophylaxis in the form of Xarelto.   PT and OT were ordered for total hip protocol.  The patient was allowed to be WBAT with therapy. Discharge planning was consulted to help with postop disposition and equipment needs.  Patient had a good night on the evening of surgery.  They started to get up OOB with therapy on day one.  Hemovac drain was pulled without difficulty. Patient was seen in rounds on day one and was ready to go home after working with therapy.  Discharge home with home health Diet - Cardiac diet Follow up - in 2 weeks Activity - WBAT Disposition - Home Condition Upon Discharge - good D/C Meds - See DC Summary DVT Prophylaxis - Xarelto      Discharge Instructions    Call MD / Call 911    Complete by:  As directed   If you experience chest pain or shortness of breath, CALL 911 and be transported to the hospital emergency room.  If you develope a fever above 101 F, pus (white drainage) or increased drainage or redness at the wound, or calf pain, call your surgeon's office.     Change dressing    Complete by:  As directed   You may start changing your dressing dressing starting tomorrow, Friday 12/05/2014, and then daily with sterile 4 x 4 inch gauze dressing and paper tape.  Do not submerge the incision under water.     Constipation Prevention    Complete by:  As directed   Drink plenty of fluids.  Prune juice may be helpful.  You may use a stool softener,  such as Colace (over the counter) 100 mg twice a day.  Use MiraLax (over the counter) for constipation as needed.     Diet - low sodium heart healthy    Complete by:  As directed      Discharge  instructions    Complete by:  As directed   Pick up stool softner and laxative for home use following surgery while on pain medications. Do not submerge incision under water. Please use good hand washing techniques while changing dressing each day. May shower starting three days after surgery. Please use a clean towel to pat the incision dry following showers. Continue to use ice for pain and swelling after surgery. Do not use any lotions or creams on the incision until instructed by your surgeon.  Total Hip Protocol.  Take Xarelto for two and a half more weeks, then discontinue Xarelto. Once the patient has completed the blood thinner regimen, then take a Baby 81 mg Aspirin daily for three more weeks.  Postoperative Constipation Protocol  Constipation - defined medically as fewer than three stools per week and severe constipation as less than one stool per week.  One of the most common issues patients have following surgery is constipation.  Even if you have a regular bowel pattern at home, your normal regimen is likely to be disrupted due to multiple reasons following surgery.  Combination of anesthesia, postoperative narcotics, change in appetite and fluid intake all can affect your bowels.  In order to avoid complications following surgery, here are some recommendations in order to help you during your recovery period.  Colace (docusate) - Pick up an over-the-counter form of Colace or another stool softener and take twice a day as long as you are requiring postoperative pain medications.  Take with a full glass of water daily.  If you experience loose stools or diarrhea, hold the colace until you stool forms back up.  If your symptoms do not get better within 1 week or if they get worse, check with your doctor.  Dulcolax (bisacodyl) - Pick up over-the-counter and take as directed by the product packaging as needed to assist with the movement of your bowels.  Take with a full glass of water.   Use this product as needed if not relieved by Colace only.   MiraLax (polyethylene glycol) - Pick up over-the-counter to have on hand.  MiraLax is a solution that will increase the amount of water in your bowels to assist with bowel movements.  Take as directed and can mix with a glass of water, juice, soda, coffee, or tea.  Take if you go more than two days without a movement. Do not use MiraLax more than once per day. Call your doctor if you are still constipated or irregular after using this medication for 7 days in a row.  If you continue to have problems with postoperative constipation, please contact the office for further assistance and recommendations.  If you experience "the worst abdominal pain ever" or develop nausea or vomiting, please contact the office immediatly for further recommendations for treatment.     Do not sit on low chairs, stoools or toilet seats, as it may be difficult to get up from low surfaces    Complete by:  As directed      Driving restrictions    Complete by:  As directed   No driving until released by the physician.     Increase activity slowly as tolerated    Complete by:  As directed      Lifting restrictions    Complete by:  As directed   No lifting until released by the physician.     Patient may shower    Complete by:  As directed   You may shower without a dressing once there is no drainage.  Do not wash over the wound.  If drainage remains, do not shower until drainage stops.     TED hose    Complete by:  As directed   Use stockings (TED hose) for 3 weeks on both leg(s).  You may remove them at night for sleeping.     Weight bearing as tolerated    Complete by:  As directed   Laterality:  right  Extremity:  Lower            Medication List    STOP taking these medications        meloxicam 7.5 MG tablet  Commonly known as:  MOBIC      TAKE these medications        diphenhydramine-acetaminophen 25-500 MG Tabs  Commonly known as:   TYLENOL PM  Take 1 tablet by mouth at bedtime as needed (sleep).     losartan-hydrochlorothiazide 100-12.5 MG per tablet  Commonly known as:  HYZAAR  Take 1 tablet by mouth every morning.     methocarbamol 500 MG tablet  Commonly known as:  ROBAXIN  Take 1 tablet (500 mg total) by mouth every 6 (six) hours as needed for muscle spasms.     oxyCODONE 5 MG immediate release tablet  Commonly known as:  Oxy IR/ROXICODONE  Take 1-3 tablets (5-15 mg total) by mouth every 3 (three) hours as needed for moderate pain, severe pain or breakthrough pain.     psyllium 0.52 G capsule  Commonly known as:  REGULOID  Take 0.52 g by mouth every morning.     rivaroxaban 10 MG Tabs tablet  Commonly known as:  XARELTO  - Take 1 tablet (10 mg total) by mouth daily with breakfast. Take Xarelto for two and a half more weeks, then discontinue Xarelto.  - Once the patient has completed the blood thinner regimen, then take a Baby 81 mg Aspirin daily for three more weeks.     traMADol 50 MG tablet  Commonly known as:  ULTRAM  Take 1-2 tablets (50-100 mg total) by mouth every 6 (six) hours as needed (mild pain).       Follow-up Information    Follow up with Gearlean Alf, MD. Schedule an appointment as soon as possible for a visit in 2 weeks.   Specialty:  Orthopedic Surgery   Why:  Call office at [phone] to setup appointment with Dr. Wynelle Link in two weeks.   Contact information:   [address] [place] [address] [phone]       Follow up with [facility].   Why:  home health physical therapy   Contact information:   [address] [place] [address] [phone]       Signed: Arlee Muslim, PA-C Orthopaedic Surgery 12/18/2014, 9:44 AM

## 2016-04-15 IMAGING — DX DG PORTABLE PELVIS
1 series · 1 of 1 positions shown · non-contrast
Comparison: 12/29/2008.

CLINICAL DATA: Right total hip replacement.

EXAM:
DG C-ARM 1-60 MIN - NRPT [facility]; PORTABLE PELVIS 1-2 VIEWS

[pelvis ap]
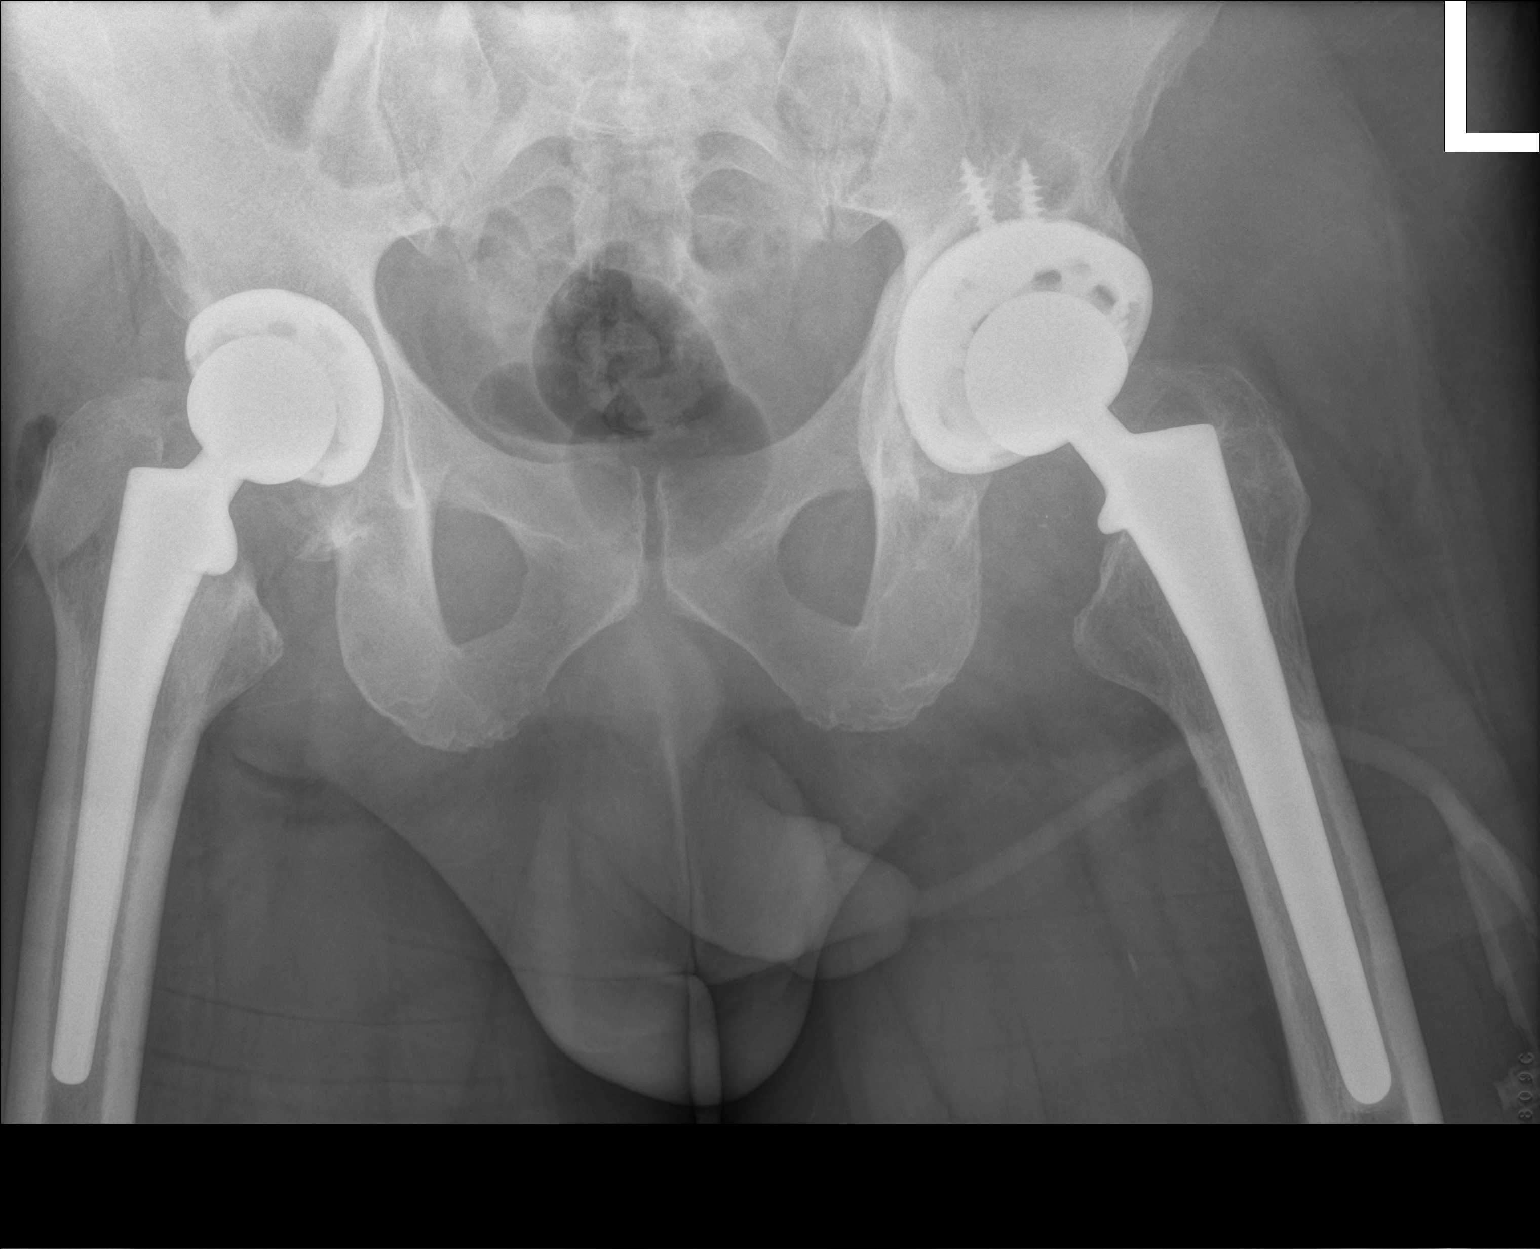

[1 of 1 positions shown; findings below may reference images not displayed]

FINDINGS: A portable frontal radiograph demonstrates bilateral hip prostheses
in place. The acetabular shell component of the left prosthesis has
fixation screws and there is a left acetabular roof lucent lesion
probably representing a subcortical cyst or geode, and questionable
left protrusio. The femoral implant on the left appears
unremarkable.

On the right, acetabular shell and femoral implant appear
unremarkable.
IMPRESSION: 1. Bilateral hip implants observed without specific complicating
feature.
2. There is a suggestion of chronic left hip protrusio as well as a
degenerative subcortical cyst or geode along the left acetabular
roof.
